# Patient Record
Sex: Female | Born: 1960 | Race: White | Hispanic: No | Marital: Married | Smoking: Never smoker
Health system: Southern US, Community
[De-identification: ages and names within clinical notes are randomized; demographics above are authoritative.]

## PROBLEM LIST (undated history)

## (undated) DIAGNOSIS — F419 Anxiety disorder, unspecified: Secondary | ICD-10-CM

## (undated) DIAGNOSIS — L409 Psoriasis, unspecified: Secondary | ICD-10-CM

## (undated) DIAGNOSIS — T7840XA Allergy, unspecified, initial encounter: Secondary | ICD-10-CM

## (undated) DIAGNOSIS — R2231 Localized swelling, mass and lump, right upper limb: Secondary | ICD-10-CM

## (undated) DIAGNOSIS — J45909 Unspecified asthma, uncomplicated: Secondary | ICD-10-CM

## (undated) DIAGNOSIS — E559 Vitamin D deficiency, unspecified: Secondary | ICD-10-CM

## (undated) DIAGNOSIS — M8580 Other specified disorders of bone density and structure, unspecified site: Secondary | ICD-10-CM

## (undated) DIAGNOSIS — I10 Essential (primary) hypertension: Secondary | ICD-10-CM

## (undated) DIAGNOSIS — G4700 Insomnia, unspecified: Secondary | ICD-10-CM

## (undated) DIAGNOSIS — E119 Type 2 diabetes mellitus without complications: Secondary | ICD-10-CM

## (undated) DIAGNOSIS — N943 Premenstrual tension syndrome: Secondary | ICD-10-CM

## (undated) DIAGNOSIS — E7800 Pure hypercholesterolemia, unspecified: Secondary | ICD-10-CM

## (undated) HISTORY — DX: Localized swelling, mass and lump, right upper limb: R22.31

## (undated) HISTORY — DX: Pure hypercholesterolemia, unspecified: E78.00

## (undated) HISTORY — DX: Other specified disorders of bone density and structure, unspecified site: M85.80

## (undated) HISTORY — DX: Unspecified asthma, uncomplicated: J45.909

## (undated) HISTORY — DX: Vitamin D deficiency, unspecified: E55.9

## (undated) HISTORY — DX: Premenstrual tension syndrome: N94.3

## (undated) HISTORY — DX: Psoriasis, unspecified: L40.9

## (undated) HISTORY — DX: Anxiety disorder, unspecified: F41.9

## (undated) HISTORY — DX: Insomnia, unspecified: G47.00

## (undated) HISTORY — DX: Allergy, unspecified, initial encounter: T78.40XA

## (undated) HISTORY — DX: Essential (primary) hypertension: I10

## (undated) HISTORY — PX: CHOLECYSTECTOMY: SHX55

## (undated) HISTORY — DX: Type 2 diabetes mellitus without complications: E11.9

---

## 1997-10-28 ENCOUNTER — Inpatient Hospital Stay (HOSPITAL_COMMUNITY): Admission: AD | Admit: 1997-10-28 | Discharge: 1997-10-28 | Payer: Self-pay | Admitting: Obstetrics and Gynecology

## 1998-01-06 ENCOUNTER — Other Ambulatory Visit: Admission: RE | Admit: 1998-01-06 | Discharge: 1998-01-06 | Payer: Self-pay | Admitting: Obstetrics and Gynecology

## 1998-02-17 ENCOUNTER — Inpatient Hospital Stay (HOSPITAL_COMMUNITY): Admission: AD | Admit: 1998-02-17 | Discharge: 1998-02-19 | Payer: Self-pay | Admitting: Obstetrics & Gynecology

## 1999-04-21 ENCOUNTER — Other Ambulatory Visit: Admission: RE | Admit: 1999-04-21 | Discharge: 1999-04-21 | Payer: Self-pay | Admitting: Obstetrics & Gynecology

## 1999-06-11 ENCOUNTER — Ambulatory Visit (HOSPITAL_COMMUNITY): Admission: RE | Admit: 1999-06-11 | Discharge: 1999-06-11 | Payer: Self-pay | Admitting: Family Medicine

## 1999-06-11 ENCOUNTER — Encounter: Payer: Self-pay | Admitting: Obstetrics & Gynecology

## 1999-06-30 ENCOUNTER — Encounter: Payer: Self-pay | Admitting: Obstetrics & Gynecology

## 1999-06-30 ENCOUNTER — Ambulatory Visit (HOSPITAL_COMMUNITY): Admission: RE | Admit: 1999-06-30 | Discharge: 1999-06-30 | Payer: Self-pay | Admitting: Obstetrics & Gynecology

## 1999-09-06 ENCOUNTER — Encounter: Payer: Self-pay | Admitting: Obstetrics and Gynecology

## 1999-09-06 ENCOUNTER — Ambulatory Visit (HOSPITAL_COMMUNITY): Admission: RE | Admit: 1999-09-06 | Discharge: 1999-09-06 | Payer: Self-pay | Admitting: Obstetrics and Gynecology

## 1999-09-27 HISTORY — PX: TUBAL LIGATION: SHX77

## 1999-12-03 ENCOUNTER — Inpatient Hospital Stay (HOSPITAL_COMMUNITY): Admission: AD | Admit: 1999-12-03 | Discharge: 1999-12-06 | Payer: Self-pay | Admitting: Family Medicine

## 2000-01-05 ENCOUNTER — Other Ambulatory Visit: Admission: RE | Admit: 2000-01-05 | Discharge: 2000-01-05 | Payer: Self-pay | Admitting: Gynecology

## 2007-11-19 DIAGNOSIS — M8580 Other specified disorders of bone density and structure, unspecified site: Secondary | ICD-10-CM

## 2007-11-19 HISTORY — DX: Other specified disorders of bone density and structure, unspecified site: M85.80

## 2008-09-26 HISTORY — PX: COLPOSCOPY: SHX161

## 2009-10-16 ENCOUNTER — Ambulatory Visit: Payer: Self-pay | Admitting: Internal Medicine

## 2009-11-25 ENCOUNTER — Ambulatory Visit: Payer: Self-pay | Admitting: Family

## 2009-12-01 ENCOUNTER — Ambulatory Visit: Payer: Self-pay | Admitting: Gastroenterology

## 2009-12-01 HISTORY — PX: COLONOSCOPY: SHX174

## 2010-01-18 ENCOUNTER — Ambulatory Visit: Payer: Self-pay | Admitting: Gastroenterology

## 2012-04-20 ENCOUNTER — Other Ambulatory Visit: Payer: Self-pay | Admitting: Gastroenterology

## 2012-04-20 LAB — CLOSTRIDIUM DIFFICILE BY PCR

## 2012-04-23 ENCOUNTER — Ambulatory Visit: Payer: Self-pay | Admitting: Gastroenterology

## 2012-09-26 HISTORY — PX: UPPER GASTROINTESTINAL ENDOSCOPY: SHX188

## 2013-01-24 DIAGNOSIS — E559 Vitamin D deficiency, unspecified: Secondary | ICD-10-CM

## 2013-01-24 HISTORY — DX: Vitamin D deficiency, unspecified: E55.9

## 2013-10-30 ENCOUNTER — Encounter: Payer: Self-pay | Admitting: Adult Health

## 2013-10-30 ENCOUNTER — Ambulatory Visit (INDEPENDENT_AMBULATORY_CARE_PROVIDER_SITE_OTHER): Payer: BC Managed Care – PPO | Admitting: Adult Health

## 2013-10-30 VITALS — BP 136/76 | HR 103 | Temp 98.2°F | Resp 12 | Ht 64.25 in | Wt 177.0 lb

## 2013-10-30 DIAGNOSIS — E785 Hyperlipidemia, unspecified: Secondary | ICD-10-CM | POA: Insufficient documentation

## 2013-10-30 DIAGNOSIS — E1165 Type 2 diabetes mellitus with hyperglycemia: Secondary | ICD-10-CM | POA: Insufficient documentation

## 2013-10-30 DIAGNOSIS — Z0000 Encounter for general adult medical examination without abnormal findings: Secondary | ICD-10-CM | POA: Insufficient documentation

## 2013-10-30 DIAGNOSIS — E119 Type 2 diabetes mellitus without complications: Secondary | ICD-10-CM

## 2013-10-30 NOTE — Progress Notes (Signed)
Patient ID: Michelle Mckenzie, female   DOB: 04/18/1961, 53 y.o.   MRN: [id number]    Subjective:    Patient ID: Michelle Minallison K Keckler, female    DOB: 01/11/1961, 53 y.o.   MRN: [id number]  HPI  Pt is a pleasant 53 y/o female who presents to clinic to establish care. She has a hx of DM and followed [facility]. She also has a GI provider, Owens Sharkawn Harrison, [facility]. She also is followed by Levin ErpAlicia Copeland at [facility]. Last PAP 2013 pt reports normal findings. Mammogram 2014 also normal. She is not currently exercising. Used to run and is planning on starting back. She reports family illnesses prevented her from running. Diet consists of healthy food options. Avoids fried foods and fast foods as these tend to affect her stomach. Overall she is feeling well.   Past Medical History  Diagnosis Date  . Diabetes mellitus without complication   . Asthma     No flares in 20 years  . Allergy      Review of Systems  Constitutional: Negative.   HENT: Negative.   Eyes: Negative.   Respiratory: Negative.   Cardiovascular: Negative.   Gastrointestinal: Negative.   Endocrine: Negative.   Genitourinary: Negative.   Musculoskeletal: Negative.   Skin: Negative.   Allergic/Immunologic: Negative.   Neurological: Negative.   Hematological: Negative.   Psychiatric/Behavioral: Negative.        Objective:  BP 136/76  Pulse 103  Temp(Src) 98.2 F (36.8 C) (Oral)  Resp 12  Ht 5' 4.25" (1.632 m)  Wt 177 lb (80.287 kg)  BMI 30.14 kg/m2  SpO2 97%   Physical Exam  Constitutional: She is oriented to person, place, and time. She appears well-developed and well-nourished. No distress.  HENT:  Head: Normocephalic and atraumatic.  Right Ear: External ear normal.  Left Ear: External ear normal.  Nose: Nose normal.  Mouth/Throat: Oropharynx is clear and moist.  Eyes: Conjunctivae and EOM are normal. Pupils are equal, round, and reactive to light.  Neck: Normal range of motion. Neck supple. No tracheal  deviation present. No thyromegaly present.  Cardiovascular: Normal rate, regular rhythm, normal heart sounds and intact distal pulses.  Exam reveals no gallop and no friction rub.   No murmur heard. Pulmonary/Chest: Effort normal and breath sounds normal. No respiratory distress. She has no wheezes. She has no rales.  Abdominal: Soft. Bowel sounds are normal. She exhibits no distension and no mass. There is no tenderness. There is no rebound and no guarding.  Musculoskeletal: Normal range of motion. She exhibits no edema and no tenderness.  Lymphadenopathy:    She has no cervical adenopathy.  Neurological: She is alert and oriented to person, place, and time. She has normal reflexes. No cranial nerve deficit. Coordination normal.  Skin: Skin is warm and dry.  Psychiatric: She has a normal mood and affect. Her behavior is normal. Judgment and thought content normal.       Assessment & Plan:    1. Routine physical examination Normal exam excluding GYN, breast. She is followed at [facility]. Labs recently done at endocrinologist office. Request records.  2. Diabetes type 2 Followed by [facility]Paul [facility]. Request records including labs.

## 2013-10-30 NOTE — Progress Notes (Signed)
Pre visit review using our clinic review tool, if applicable. No additional management support is needed unless otherwise documented below in the visit note. 

## 2013-10-30 NOTE — Patient Instructions (Signed)
  Thank you for choosing [facility] for your [facility] needs.  If you have any question or concerns please feel free to call our office.  Remember to activate your MyChart Account. The instructions are at the end of this form.

## 2013-11-01 ENCOUNTER — Telehealth: Payer: Self-pay

## 2013-11-01 NOTE — Telephone Encounter (Signed)
Relevant patient education mailed to patient.  

## 2013-12-25 ENCOUNTER — Other Ambulatory Visit: Payer: Self-pay | Admitting: Adult Health

## 2013-12-25 DIAGNOSIS — E119 Type 2 diabetes mellitus without complications: Secondary | ICD-10-CM

## 2014-04-01 ENCOUNTER — Telehealth: Payer: Self-pay

## 2014-04-01 NOTE — Telephone Encounter (Signed)
Per Michelle Mckenzie called patient to notify her that she needs to schedule a diabetic follow up appt and needs lab work. Patient did not answer. Detailed message left on voicemail.

## 2014-05-01 DIAGNOSIS — E669 Obesity, unspecified: Secondary | ICD-10-CM | POA: Insufficient documentation

## 2014-05-05 ENCOUNTER — Encounter: Payer: Self-pay | Admitting: *Deleted

## 2014-05-22 DIAGNOSIS — E559 Vitamin D deficiency, unspecified: Secondary | ICD-10-CM | POA: Insufficient documentation

## 2014-11-14 ENCOUNTER — Encounter: Payer: Self-pay | Admitting: Nurse Practitioner

## 2014-11-14 ENCOUNTER — Ambulatory Visit (INDEPENDENT_AMBULATORY_CARE_PROVIDER_SITE_OTHER): Payer: BC Managed Care – PPO | Admitting: Nurse Practitioner

## 2014-11-14 VITALS — BP 140/84 | HR 93 | Temp 98.5°F | Resp 12 | Ht 64.25 in | Wt 154.0 lb

## 2014-11-14 DIAGNOSIS — Z8639 Personal history of other endocrine, nutritional and metabolic disease: Secondary | ICD-10-CM

## 2014-11-14 NOTE — Progress Notes (Signed)
   Subjective:    Patient ID: Michelle Mckenzie, female    DOB: 10/25/1960, 54 y.o.   MRN: [id number]  HPI  [name] is a 54 yo female with a CC of cold arms x 6 months.   1) Pt letting me know:  Eye exam in last 2 years  Refuses flu shot   2) Arms from shoulders down stay cold, painful started about 6 months ago, feels like ice is running through her veins, intermittent, doesn't notice at a certain time of day, does not affect grip or holding items Multivitamin Thrive for women- no iron  Review of Systems  Constitutional: Positive for chills. Negative for fever, diaphoresis and fatigue.  Respiratory: Negative for chest tightness, shortness of breath and wheezing.   Cardiovascular: Negative for chest pain, palpitations and leg swelling.  Gastrointestinal: Negative for nausea, vomiting, diarrhea and rectal pain.  Skin: Negative for rash.  Neurological: Negative for dizziness, weakness, numbness and headaches.  Psychiatric/Behavioral: The patient is not nervous/anxious.        Objective:   Physical Exam  Constitutional: She is oriented to person, place, and time. She appears well-developed and well-nourished. No distress.  BP 140/84 mmHg  Pulse 93  Temp(Src) 98.5 F (36.9 C) (Oral)  Resp 12  Ht 5' 4.25" (1.632 m)  Wt 154 lb (69.854 kg)  BMI 26.23 kg/m2  SpO2 98%   HENT:  Head: Normocephalic and atraumatic.  Right Ear: External ear normal.  Left Ear: External ear normal.  Cardiovascular: Normal rate, regular rhythm, normal heart sounds and intact distal pulses.  Exam reveals no gallop and no friction rub.   No murmur heard. Pulmonary/Chest: Effort normal and breath sounds normal. No respiratory distress. She has no wheezes. She has no rales. She exhibits no tenderness.  Neurological: She is alert and oriented to person, place, and time. No cranial nerve deficit. She exhibits normal muscle tone. Coordination normal.  Skin: Skin is warm and dry. No rash noted. She is not  diaphoretic.  Cold upper extremities and spoon shaped nails. Suspect anemia. [facility]   Psychiatric: She has a normal mood and affect. Her behavior is normal. Judgment and thought content normal.      Assessment & Plan:

## 2014-11-14 NOTE — Progress Notes (Signed)
Pre visit review using our clinic review tool, if applicable. No additional management support is needed unless otherwise documented below in the visit note. 

## 2014-11-14 NOTE — Patient Instructions (Signed)
Iron Deficiency Anemia Anemia is a condition in which there are less red blood cells or hemoglobin in the blood than normal. Hemoglobin is the part of red blood cells that carries oxygen. Iron deficiency anemia is anemia caused by too little iron. It is the most common type of anemia. It may leave you tired and short of breath. CAUSES   Lack of iron in the diet.  Poor absorption of iron, as seen with intestinal disorders.  Intestinal bleeding.  Heavy periods. SIGNS AND SYMPTOMS  Mild anemia may not be noticeable. Symptoms may include:  Fatigue.  Headache.  Pale skin.  Weakness.  Tiredness.  Shortness of breath.  Dizziness.  Cold hands and feet.  Fast or irregular heartbeat. DIAGNOSIS  Diagnosis requires a thorough evaluation and physical exam by your health care provider. Blood tests are generally used to confirm iron deficiency anemia. Additional tests may be done to find the underlying cause of your anemia. These may include:  Testing for blood in the stool (fecal occult blood test).  A procedure to see inside the colon and rectum (colonoscopy).  A procedure to see inside the esophagus and stomach (endoscopy). TREATMENT  Iron deficiency anemia is treated by correcting the cause of the deficiency. Treatment may involve:  Adding iron-rich foods to your diet.  Taking iron supplements. Pregnant or breastfeeding women need to take extra iron because their normal diet usually does not provide the required amount.  Taking vitamins. Vitamin C improves the absorption of iron. Your health care provider may recommend that you take your iron tablets with a glass of orange juice or vitamin C supplement.  Medicines to make heavy menstrual flow lighter.  Surgery. HOME CARE INSTRUCTIONS   Take iron as directed by your health care provider.  If you cannot tolerate taking iron supplements by mouth, talk to your health care provider about taking them through a vein  (intravenously) or an injection into a muscle.  For the best iron absorption, iron supplements should be taken on an empty stomach. If you cannot tolerate them on an empty stomach, you may need to take them with food.  Do not drink milk or take antacids at the same time as your iron supplements. Milk and antacids may interfere with the absorption of iron.  Iron supplements can cause constipation. Make sure to include fiber in your diet to prevent constipation. A stool softener may also be recommended.  Take vitamins as directed by your health care provider.  Eat a diet rich in iron. Foods high in iron include liver, lean beef, whole-grain bread, eggs, dried fruit, and dark green leafy vegetables. SEEK IMMEDIATE MEDICAL CARE IF:   You faint. If this happens, do not drive. Call your local emergency services (911 in [place] if no other help is available.  You have chest pain.  You feel nauseous or vomit.  You have severe or increased shortness of breath with activity.  You feel weak.  You have a rapid heartbeat.  You have unexplained sweating.  You become light-headed when getting up from a chair or bed. MAKE SURE YOU:   Understand these instructions.  Will watch your condition.  Will get help right away if you are not doing well or get worse. Document Released: 09/09/2000 Document Revised: 09/17/2013 Document Reviewed: 05/20/2013 ExitCare Patient Information 2015 [employer]. This information is not intended to replace advice given to you by your health care provider. Make sure you discuss any questions you have with your health care provider.  

## 2014-11-17 DIAGNOSIS — Z8639 Personal history of other endocrine, nutritional and metabolic disease: Secondary | ICD-10-CM | POA: Insufficient documentation

## 2014-11-17 NOTE — Assessment & Plan Note (Signed)
Pt reports a history of IDA. She thinks she had a CBC done and I could not find it in care everywhere. I asked her to give us results or talk with the provider who ordered lab work. This may explain the symptoms she is experiencing. Asked her to take an OTC ferrous sulfate 325 mg daily supplement. Gave handout on IDA. FU in 3 months.

## 2015-02-12 ENCOUNTER — Ambulatory Visit: Payer: BC Managed Care – PPO | Admitting: Nurse Practitioner

## 2015-04-20 ENCOUNTER — Telehealth: Payer: Self-pay | Admitting: Nurse Practitioner

## 2015-04-20 NOTE — Telephone Encounter (Signed)
Good work! Thanks for your help.

## 2015-04-20 NOTE — Telephone Encounter (Signed)
Please advise.  You last saw her to establish care on 2/19.

## 2015-04-20 NOTE — Telephone Encounter (Signed)
The patient is wanting to know if she should continue to see a chiropractor

## 2015-04-20 NOTE — Telephone Encounter (Signed)
I spoke with the patient, she has a pinched nerve in her back that is affecting her arms.  She was taking a large amount of advil, which has upset her stomach.  She is requesting advice on what medication she can take instead.  I advised her that large amounts of advil are not a good choice.  I have scheduled her to see you on Wednesday this week to discuss further.

## 2015-04-20 NOTE — Telephone Encounter (Signed)
Please advise the patient that is not a recommendation for [place] to make. It is her personal choice. If she is having a complaint that needs to be evaluated she is welcome to make an appointment. Thanks!

## 2015-04-22 ENCOUNTER — Ambulatory Visit (INDEPENDENT_AMBULATORY_CARE_PROVIDER_SITE_OTHER): Payer: BC Managed Care – PPO | Admitting: Nurse Practitioner

## 2015-04-22 ENCOUNTER — Encounter (INDEPENDENT_AMBULATORY_CARE_PROVIDER_SITE_OTHER): Payer: Self-pay

## 2015-04-22 VITALS — BP 138/100 | HR 101 | Temp 98.0°F | Resp 16 | Ht 64.0 in | Wt 150.4 lb

## 2015-04-22 DIAGNOSIS — M25512 Pain in left shoulder: Secondary | ICD-10-CM | POA: Diagnosis not present

## 2015-04-22 MED ORDER — CYCLOBENZAPRINE HCL 5 MG PO TABS: 5.0000 mg | ORAL_TABLET | Freq: Every day | ORAL | Status: DC

## 2015-04-22 MED ORDER — PREDNISONE 10 MG PO TABS: ORAL_TABLET | ORAL | Status: DC

## 2015-04-22 NOTE — Patient Instructions (Addendum)
Prednisone with breakfast  6 tablets on day 1, 5 tablets on day 2, 4 tablets on day 3, 3 tablets on day 4, 2 tablets day 5, 1 tablet on day 6...done! Do not take with Advil, aspirin, ibuprofen ect...  Flexeril- at night (can take during day if not driving or operating machinery)   Ice (not heat yet).   Call me Friday if not having relief.

## 2015-04-22 NOTE — Progress Notes (Signed)
   Subjective:    Patient ID: Michelle Mckenzie, female    DOB: 09/25/1961, 54 y.o.   MRN: [id number]  HPI  Michelle Mckenzie is a 54 yo female with a CC of left shoulder pain x 6 months.   1) 6 months of ongoing left shoulder pain. 2 weeks worsening since seeing the Chiropractor, having headaches, mid-lateral arm, trapezius muscles and down spine   Advil- upset her stomach  Chiropractor- not helpful  Ice- helpful   Review of Systems  Constitutional: Negative for fever, chills, diaphoresis and fatigue.  Respiratory: Negative for chest tightness, shortness of breath and wheezing.   Cardiovascular: Negative for chest pain, palpitations and leg swelling.  Gastrointestinal: Negative for nausea, vomiting and diarrhea.  Musculoskeletal: Positive for arthralgias.  Skin: Negative for rash.  Neurological: Negative for dizziness, weakness, numbness and headaches.  Psychiatric/Behavioral: The patient is not nervous/anxious.       Objective:   Physical Exam  Constitutional: She is oriented to person, place, and time. She appears well-developed and well-nourished. No distress.  BP 138/100 mmHg  Pulse 101  Temp(Src) 98 F (36.7 C)  Resp 16  Ht  (1.626 m)  Wt 150 lb 6.4 oz (68.221 kg)  BMI 25.80 kg/m2  SpO2 97%   HENT:  Head: Normocephalic and atraumatic.  Right Ear: External ear normal.  Left Ear: External ear normal.  Cardiovascular: Normal rate, regular rhythm and normal heart sounds.  Exam reveals no gallop and no friction rub.   No murmur heard. Pulmonary/Chest: Effort normal and breath sounds normal. No respiratory distress. She has no wheezes. She has no rales. She exhibits no tenderness.  Musculoskeletal: She exhibits tenderness. She exhibits no edema.  Neurological: She is alert and oriented to person, place, and time. No cranial nerve deficit. She exhibits normal muscle tone. Coordination normal.  Skin: Skin is warm and dry. No rash noted. She is not diaphoretic.  Psychiatric:  She has a normal mood and affect. Her behavior is normal. Judgment and thought content normal.      Assessment & Plan:

## 2015-04-22 NOTE — Progress Notes (Signed)
Pre visit review using our clinic review tool, if applicable. No additional management support is needed unless otherwise documented below in the visit note. 

## 2015-05-03 ENCOUNTER — Encounter: Payer: Self-pay | Admitting: Nurse Practitioner

## 2015-05-03 NOTE — Assessment & Plan Note (Signed)
Worsening. Prednisone taper with instructions on AVS and reviewed w/ pt to satisfaction. Will also try flexeril and ice. FU Friday by phone if not helping.

## 2015-05-29 ENCOUNTER — Telehealth: Payer: Self-pay | Admitting: Nurse Practitioner

## 2015-05-29 ENCOUNTER — Other Ambulatory Visit: Payer: Self-pay | Admitting: Nurse Practitioner

## 2015-05-29 DIAGNOSIS — M25512 Pain in left shoulder: Secondary | ICD-10-CM

## 2015-05-29 MED ORDER — MELOXICAM 15 MG PO TABS: 15.0000 mg | ORAL_TABLET | Freq: Every day | ORAL | Status: DC

## 2015-05-29 NOTE — Telephone Encounter (Signed)
Pt would like to discuss some issues with shoulder and neck and possible needing an xray. Thank You!

## 2015-05-29 NOTE — Telephone Encounter (Signed)
Spoke with pt, she states she prefers to be referred to [facility].  She also requests what she should do in reference to pain management in the meantime.

## 2015-05-29 NOTE — Telephone Encounter (Signed)
Spoke with pt advised of message.  Pt verbalized understanding. 

## 2015-05-29 NOTE — Telephone Encounter (Signed)
Let pt know: I can place an x-ray order but that typically doesn't show the rotator cuff. This is the first step so insurance can pay. We can get an MRI scheduled after xray results. Other option is to go to Ortho and they can get imaging quickly. Let me know what she decides.

## 2015-05-29 NOTE — Telephone Encounter (Signed)
I will call in some meloxicam for the weekend, ice, and minimize use of shoulder. Take as directed and I will place that order. Thanks!

## 2015-05-29 NOTE — Telephone Encounter (Signed)
Pt was seen on 7.27.16 given Prednisone pt is requesting further testing because she believes it is possibly her rotator cuff.  States she is still having severe pain at night.  Please advise

## 2015-06-24 ENCOUNTER — Other Ambulatory Visit: Payer: Self-pay | Admitting: Nurse Practitioner

## 2015-07-01 ENCOUNTER — Other Ambulatory Visit: Payer: Self-pay | Admitting: Student

## 2015-07-01 DIAGNOSIS — M7582 Other shoulder lesions, left shoulder: Secondary | ICD-10-CM

## 2015-07-01 DIAGNOSIS — M25512 Pain in left shoulder: Secondary | ICD-10-CM

## 2015-07-08 ENCOUNTER — Ambulatory Visit: Payer: BC Managed Care – PPO

## 2015-08-06 ENCOUNTER — Ambulatory Visit (INDEPENDENT_AMBULATORY_CARE_PROVIDER_SITE_OTHER): Payer: BC Managed Care – PPO | Admitting: Nurse Practitioner

## 2015-08-06 ENCOUNTER — Encounter: Payer: Self-pay | Admitting: Nurse Practitioner

## 2015-08-06 VITALS — BP 122/76 | HR 80 | Temp 98.3°F | Wt 140.0 lb

## 2015-08-06 DIAGNOSIS — J0110 Acute frontal sinusitis, unspecified: Secondary | ICD-10-CM | POA: Diagnosis not present

## 2015-08-06 MED ORDER — FLUTICASONE PROPIONATE 50 MCG/ACT NA SUSP: 2.0000 | Freq: Every day | NASAL | Status: DC

## 2015-08-06 NOTE — Patient Instructions (Signed)

## 2015-08-13 DIAGNOSIS — J019 Acute sinusitis, unspecified: Secondary | ICD-10-CM | POA: Insufficient documentation

## 2015-08-13 NOTE — Progress Notes (Signed)
Patient ID: Michelle Minallison K Vanderlinde, female    DOB: 04/15/1961  Age: 54 y.o. MRN: [id number]  CC: Sinusitis   HPI Michelle Mckenzie presents for CC of 1 week of sinusitis.  1) Patient reports sinus drainage, itchy throat, face and ear pressure. Frontal headache. Denies sick contacts  Denies tooth pain or malodorous breath   History Michelle Mckenzie has a past medical history of Diabetes mellitus without complication (HCC); Asthma; and Allergy.   She has past surgical history that includes Cholecystectomy; Colonoscopy (2010); and Upper gastrointestinal endoscopy (2014).   Her family history includes Arthritis in her maternal grandfather, maternal grandmother, paternal grandfather, and paternal grandmother; Cancer in her brother and father; Heart disease (age of onset: 6950) in her paternal grandmother; Stroke in her maternal grandmother.She reports that she has never smoked. She has never used smokeless tobacco. She reports that she drinks alcohol. She reports that she does not use illicit drugs.  Outpatient Prescriptions Prior to Visit  Medication Sig Dispense Refill  . Apremilast 30 MG TABS Take 1 tablet by mouth 2 (two) times daily.    . Canagliflozin (INVOKANA PO) Take 1 tablet by mouth daily.    . hydrOXYzine (ATARAX/VISTARIL) 25 MG tablet Take 25 mg by mouth at bedtime.     . meloxicam (MOBIC) 15 MG tablet TAKE 1 TABLET BY MOUTH EVERY DAY 30 tablet 5  . metformin (FORTAMET) 1000 MG (OSM) 24 hr tablet Take 2,000 mg by mouth at bedtime.     Marland Kitchen. MICRONIZED COLESTIPOL HCL 1 G tablet Take 1 g by mouth 2 (two) times daily.     Marland Kitchen. NOVOTWIST 32G X 5 MM MISC     . VICTOZA 18 MG/3ML SOPN Inject 1.8 Units into the skin daily.     Marland Kitchen. zolpidem (AMBIEN) 10 MG tablet Take 10 mg by mouth at bedtime. Take 1/2 tablet at bedtime    . cyclobenzaprine (FLEXERIL) 5 MG tablet Take 1 tablet (5 mg total) by mouth at bedtime. (Patient not taking: Reported on 08/06/2015) 30 tablet 0   No facility-administered medications prior to  visit.    ROS Review of Systems  Constitutional: Negative for fever, chills, diaphoresis and fatigue.  HENT: Positive for congestion, ear pain, postnasal drip and rhinorrhea. Negative for ear discharge.   Eyes: Negative for visual disturbance.  Respiratory: Positive for cough. Negative for chest tightness, shortness of breath and wheezing.   Cardiovascular: Negative for chest pain, palpitations and leg swelling.  Gastrointestinal: Negative for nausea, vomiting and diarrhea.  Skin: Negative for rash.  Neurological: Negative for dizziness, weakness, numbness and headaches.  Psychiatric/Behavioral: The patient is not nervous/anxious.     Objective:  BP 122/76 mmHg  Pulse 80  Temp(Src) 98.3 F (36.8 C) (Oral)  Wt 140 lb (63.504 kg)  SpO2 97%  Physical Exam  Constitutional: She is oriented to person, place, and time. She appears well-developed and well-nourished. No distress.  HENT:  Head: Normocephalic and atraumatic.  Right Ear: External ear normal.  Left Ear: External ear normal.  TMs clear bilaterally  Eyes: EOM are normal. Pupils are equal, round, and reactive to light. Right eye exhibits no discharge. Left eye exhibits no discharge. No scleral icterus.  Neck: Normal range of motion. Neck supple.  Cardiovascular: Normal rate, regular rhythm and normal heart sounds.  Exam reveals no gallop and no friction rub.   No murmur heard. Pulmonary/Chest: Effort normal and breath sounds normal. No respiratory distress. She has no wheezes. She has no rales. She exhibits no tenderness.  Lymphadenopathy:    She has no cervical adenopathy.  Neurological: She is alert and oriented to person, place, and time. No cranial nerve deficit. She exhibits normal muscle tone. Coordination normal.  Skin: Skin is warm and dry. No rash noted. She is not diaphoretic.  Psychiatric: She has a normal mood and affect. Her behavior is normal. Judgment and thought content normal.   Assessment & Plan:    Vana was seen today for sinusitis.  Diagnoses and all orders for this visit:  Acute frontal sinusitis, recurrence not specified  Other orders -     fluticasone (FLONASE) 50 MCG/ACT nasal spray; Place 2 sprays into both nostrils daily.  I am having Ms. Dangerfield start on fluticasone. I am also having her maintain her MICRONIZED COLESTIPOL HCL, hydrOXYzine, zolpidem, metformin, VICTOZA, NOVOTWIST, Canagliflozin (INVOKANA PO), Apremilast, cyclobenzaprine, meloxicam, and cetirizine.  Meds ordered this encounter  Medications  . cetirizine (ZYRTEC) 10 MG tablet    Sig: Take 10 mg by mouth daily.  . fluticasone (FLONASE) 50 MCG/ACT nasal spray    Sig: Place 2 sprays into both nostrils daily.    Dispense:  16 g    Refill:  2    Order Specific Question:  Supervising Provider    Answer:  Sherlene Shams [[id number]]     Follow-up: Return if symptoms worsen or fail to improve.

## 2015-08-13 NOTE — Assessment & Plan Note (Signed)
Flonase and Zyrtec given to pt. If not helpful let me know and I will send in abx.   Will follow

## 2015-11-18 ENCOUNTER — Other Ambulatory Visit: Payer: Self-pay

## 2015-11-18 MED ORDER — MELOXICAM 15 MG PO TABS: 15.0000 mg | ORAL_TABLET | Freq: Every day | ORAL | Status: DC

## 2015-11-25 ENCOUNTER — Encounter: Payer: Self-pay | Admitting: Nurse Practitioner

## 2015-11-25 ENCOUNTER — Ambulatory Visit (INDEPENDENT_AMBULATORY_CARE_PROVIDER_SITE_OTHER): Payer: BC Managed Care – PPO | Admitting: Nurse Practitioner

## 2015-11-25 VITALS — BP 126/84 | HR 95 | Temp 98.0°F | Ht 64.0 in | Wt 151.0 lb

## 2015-11-25 DIAGNOSIS — K591 Functional diarrhea: Secondary | ICD-10-CM | POA: Diagnosis not present

## 2015-11-25 DIAGNOSIS — R21 Rash and other nonspecific skin eruption: Secondary | ICD-10-CM | POA: Diagnosis not present

## 2015-11-25 MED ORDER — COLESTIPOL HCL 1 G PO TABS: 1.0000 g | ORAL_TABLET | Freq: Two times a day (BID) | ORAL | Status: DC

## 2015-11-25 MED ORDER — NYSTATIN 100000 UNIT/GM EX OINT: 1.0000 "application " | TOPICAL_OINTMENT | Freq: Two times a day (BID) | CUTANEOUS | Status: DC

## 2015-11-25 NOTE — Progress Notes (Signed)
Pre visit review using our clinic review tool, if applicable. No additional management support is needed unless otherwise documented below in the visit note. 

## 2015-11-25 NOTE — Progress Notes (Signed)
Patient ID: AARON BOEH, female    DOB: 17-Apr-1961  Age: 55 y.o. MRN: [id number]  CC: Acute Visit   HPI Michelle Mckenzie presents for CC of breast rash and wanting to discuss colestipol  1) Rash under breasts worsening over the last week Wears a sports bra when running, but showers soon after  Water and gold bond powder Tea tree oil   2) Upper and lower endoscopy  Has diarrhea after gallbladder was removed  Tried being off for 1.5 months   History Michelle Mckenzie has a past medical history of Diabetes mellitus without complication (HCC); Asthma; and Allergy.   Michelle Mckenzie has past surgical history that includes Cholecystectomy; Colonoscopy (2010); and Upper gastrointestinal endoscopy (2014).   Her family history includes Arthritis in her maternal grandfather, maternal grandmother, paternal grandfather, and paternal grandmother; Cancer in her brother and father; Heart disease (age of onset: 65) in her paternal grandmother; Stroke in her maternal grandmother.Michelle Mckenzie reports that Michelle Mckenzie has never smoked. Michelle Mckenzie has never used smokeless tobacco. Michelle Mckenzie reports that Michelle Mckenzie drinks alcohol. Michelle Mckenzie reports that Michelle Mckenzie does not use illicit drugs.  Outpatient Prescriptions Prior to Visit  Medication Sig Dispense Refill  . Apremilast 30 MG TABS Take 1 tablet by mouth 2 (two) times daily.    . Canagliflozin (INVOKANA PO) Take 1 tablet by mouth daily.    . cetirizine (ZYRTEC) 10 MG tablet Take 10 mg by mouth daily.    . fluticasone (FLONASE) 50 MCG/ACT nasal spray Place 2 sprays into both nostrils daily. 16 g 2  . hydrOXYzine (ATARAX/VISTARIL) 25 MG tablet Take 25 mg by mouth at bedtime.     . meloxicam (MOBIC) 15 MG tablet Take 1 tablet (15 mg total) by mouth daily. 30 tablet 5  . metformin (FORTAMET) 1000 MG (OSM) 24 hr tablet Take 2,000 mg by mouth at bedtime.     Marland Kitchen NOVOTWIST 32G X 5 MM MISC     . VICTOZA 18 MG/3ML SOPN Inject 1.8 Units into the skin daily.     Marland Kitchen zolpidem (AMBIEN) 10 MG tablet Take 10 mg by mouth at bedtime.  Take 1/2 tablet at bedtime    . cyclobenzaprine (FLEXERIL) 5 MG tablet Take 1 tablet (5 mg total) by mouth at bedtime. 30 tablet 0  . MICRONIZED COLESTIPOL HCL 1 G tablet Take 1 g by mouth 2 (two) times daily.      No facility-administered medications prior to visit.    ROS Review of Systems  Constitutional: Negative for fever, chills, diaphoresis and fatigue.  Respiratory: Negative for chest tightness, shortness of breath and wheezing.   Cardiovascular: Negative for chest pain, palpitations and leg swelling.  Gastrointestinal: Positive for diarrhea. Negative for nausea and vomiting.  Skin: Positive for rash.  Neurological: Negative for dizziness and headaches.    Objective:  BP 126/84 mmHg  Pulse 95  Temp(Src) 98 F (36.7 C) (Oral)  Ht  (1.626 m)  Wt 151 lb (68.493 kg)  BMI 25.91 kg/m2  SpO2 98%  Physical Exam  Constitutional: Michelle Mckenzie is oriented to person, place, and time. Michelle Mckenzie appears well-developed and well-nourished. No distress.  HENT:  Head: Normocephalic and atraumatic.  Right Ear: External ear normal.  Left Ear: External ear normal.  Cardiovascular: Normal rate, regular rhythm and normal heart sounds.  Exam reveals no gallop and no friction rub.   No murmur heard. Pulmonary/Chest: Effort normal and breath sounds normal. No respiratory distress. Michelle Mckenzie has no wheezes. Michelle Mckenzie has no rales. Michelle Mckenzie exhibits no tenderness.  Neurological:  Michelle Mckenzie is alert and oriented to person, place, and time. No cranial nerve deficit. Michelle Mckenzie exhibits normal muscle tone. Coordination normal.  Skin: Skin is warm and dry. Rash noted. Michelle Mckenzie is not diaphoretic.     Red, papular to patchy, odorous under breasts bilateally  Psychiatric: Michelle Mckenzie has a normal mood and affect. Her behavior is normal. Judgment and thought content normal.   Assessment & Plan:   Sharea was seen today for acute visit.  Diagnoses and all orders for this visit:  Rash and nonspecific skin eruption  Functional diarrhea  Other  orders -     colestipol (MICRONIZED COLESTIPOL HCL) 1 g tablet; Take 1 tablet (1 g total) by mouth 2 (two) times daily. -     nystatin ointment (MYCOSTATIN); Apply 1 application topically 2 (two) times daily.   I have discontinued Ms. Eimer's cyclobenzaprine. I have also changed her MICRONIZED COLESTIPOL HCL to colestipol. Additionally, I am having her start on nystatin ointment. Lastly, I am having her maintain her hydrOXYzine, zolpidem, metformin, VICTOZA, NOVOTWIST, Canagliflozin (INVOKANA PO), Apremilast, cetirizine, fluticasone, and meloxicam.  Meds ordered this encounter  Medications  . colestipol (MICRONIZED COLESTIPOL HCL) 1 g tablet    Sig: Take 1 tablet (1 g total) by mouth 2 (two) times daily.    Dispense:  60 tablet    Refill:  2    Order Specific Question:  Supervising Provider    Answer:  Duncan Dull L [[id number]]  . nystatin ointment (MYCOSTATIN)    Sig: Apply 1 application topically 2 (two) times daily.    Dispense:  30 g    Refill:  0    Order Specific Question:  Supervising Provider    Answer:  Sherlene Shams [[id number]]     Follow-up: Return if symptoms worsen or fail to improve.

## 2015-11-30 DIAGNOSIS — R21 Rash and other nonspecific skin eruption: Secondary | ICD-10-CM | POA: Insufficient documentation

## 2015-11-30 DIAGNOSIS — R197 Diarrhea, unspecified: Secondary | ICD-10-CM | POA: Insufficient documentation

## 2015-11-30 NOTE — Assessment & Plan Note (Signed)
Nystatin ointment sent to pharmacy  Likely yeast FU prn worsening/failure to improve.

## 2015-11-30 NOTE — Assessment & Plan Note (Signed)
Colestipol was helpful Sent back into pharmacy Will follow as needed

## 2015-12-15 ENCOUNTER — Telehealth: Payer: BC Managed Care – PPO | Admitting: Nurse Practitioner

## 2015-12-15 ENCOUNTER — Encounter (INDEPENDENT_AMBULATORY_CARE_PROVIDER_SITE_OTHER): Payer: Self-pay

## 2015-12-15 DIAGNOSIS — J0101 Acute recurrent maxillary sinusitis: Secondary | ICD-10-CM

## 2015-12-15 MED ORDER — AZITHROMYCIN 250 MG PO TABS: ORAL_TABLET | ORAL | Status: DC

## 2015-12-15 NOTE — Progress Notes (Signed)

## 2016-01-20 ENCOUNTER — Other Ambulatory Visit: Payer: Self-pay | Admitting: Nurse Practitioner

## 2016-01-21 ENCOUNTER — Encounter: Payer: Self-pay | Admitting: Nurse Practitioner

## 2016-03-06 ENCOUNTER — Other Ambulatory Visit: Payer: Self-pay | Admitting: Nurse Practitioner

## 2016-03-08 NOTE — Telephone Encounter (Signed)
No future appointment. Last seen 11/25/15 and medication last refilled on 11/25/15. Please advise?

## 2016-03-09 NOTE — Telephone Encounter (Signed)
Refills need to come from PCP in future. Michelle Mckenzie is new PCP.

## 2016-03-09 NOTE — Telephone Encounter (Signed)
Pt called in about the medication colestipol (MICRONIZED COLESTIPOL HCL) 1 g tablet.   Thank you!

## 2016-05-02 ENCOUNTER — Other Ambulatory Visit: Payer: Self-pay | Admitting: Nurse Practitioner

## 2016-05-02 NOTE — Telephone Encounter (Signed)
Refill provided. Please get the patient scheduled sometime in the next 1-2 months for follow-up. Thanks.

## 2016-05-02 NOTE — Telephone Encounter (Signed)
Refilled 11/18/15 with 5 refills. You are listed has PCP but you have not seen patient nor does she have a appointment scheduled. Please advise?

## 2016-05-02 NOTE — Telephone Encounter (Signed)
A voicemail was left on [facility] designated phone number for pt to call and schedule an appointment with Dr.Sonnenberg.

## 2016-06-11 ENCOUNTER — Other Ambulatory Visit: Payer: Self-pay | Admitting: Family Medicine

## 2016-06-13 NOTE — Telephone Encounter (Signed)
Refilled 03/09/16 by Dr.Cook. Pt last seen 11/25/15 by Lyla Sonarrie. Pt has no future appt. Pt does not have a recent lipid panel in chart. Please advise?

## 2016-06-13 NOTE — Telephone Encounter (Signed)
Refill given. Patient needs follow-up scheduled. Thanks. 

## 2016-06-14 NOTE — Telephone Encounter (Signed)
That is fine 

## 2016-06-14 NOTE — Telephone Encounter (Signed)
Scheduled pt on 10/4 at 4 p.m. Which was the only time she could do. Are you okay with her being at that time?

## 2016-06-24 ENCOUNTER — Other Ambulatory Visit: Payer: Self-pay | Admitting: Surgical

## 2016-06-24 MED ORDER — MELOXICAM 15 MG PO TABS: 15.0000 mg | ORAL_TABLET | Freq: Every day | ORAL | 1 refills | Status: DC

## 2016-06-27 ENCOUNTER — Other Ambulatory Visit: Payer: Self-pay | Admitting: Family Medicine

## 2016-06-29 ENCOUNTER — Encounter: Payer: Self-pay | Admitting: Family Medicine

## 2016-06-29 ENCOUNTER — Ambulatory Visit (INDEPENDENT_AMBULATORY_CARE_PROVIDER_SITE_OTHER): Payer: BC Managed Care – PPO | Admitting: Family Medicine

## 2016-06-29 DIAGNOSIS — K591 Functional diarrhea: Secondary | ICD-10-CM | POA: Diagnosis not present

## 2016-06-29 DIAGNOSIS — E119 Type 2 diabetes mellitus without complications: Secondary | ICD-10-CM

## 2016-06-29 DIAGNOSIS — J302 Other seasonal allergic rhinitis: Secondary | ICD-10-CM | POA: Diagnosis not present

## 2016-06-29 DIAGNOSIS — I10 Essential (primary) hypertension: Secondary | ICD-10-CM | POA: Insufficient documentation

## 2016-06-29 DIAGNOSIS — J309 Allergic rhinitis, unspecified: Secondary | ICD-10-CM | POA: Insufficient documentation

## 2016-06-29 DIAGNOSIS — R030 Elevated blood-pressure reading, without diagnosis of hypertension: Secondary | ICD-10-CM

## 2016-06-29 MED ORDER — MONTELUKAST SODIUM 10 MG PO TABS: 10.0000 mg | ORAL_TABLET | Freq: Every day | ORAL | 3 refills | Status: DC

## 2016-06-29 NOTE — Assessment & Plan Note (Signed)
Well-controlled. Continue to follow with endocrinology. 

## 2016-06-29 NOTE — Assessment & Plan Note (Signed)
Suspect patient's right ear symptoms are related to eustachian tube dysfunction from allergic rhinitis. Hearing is intact. We will add Singulair and see if this is beneficial. If not improving in the next couple of weeks we will refer to ENT.

## 2016-06-29 NOTE — Assessment & Plan Note (Signed)
Chronic issue. Well managed with colestipol. She will continue this medication.

## 2016-06-29 NOTE — Progress Notes (Signed)
  Marikay Alar, MD Phone: [phone]  Michelle Mckenzie is a 55 y.o. female who presents today for f/u.  DIABETES-followed by endocrinology Disease Monitoring: Blood Sugar ranges-not checking, most recent A1c was 6.1 Polyuria/phagia/dipsia- no      Medications: Compliance- taking metformin and Victoza Hypoglycemic symptoms- no  Right ear fullness: Patient notes for the last 3 weeks it's felt as though there is a cup over her right ear. She always has some sinus congestion related to allergies. No ear pain. No vertigo. Some mild ringing at times. Taking Zyrtec and Flonase.  Elevated blood pressure: No history of hypertension. Not on any medications. Does not check at home. No chest pain or shortness breath.  Chronic diarrhea: Well controlled by colestipol. Has been on this for about 10 years. Had issues with diarrhea related to having her gallbladder taken out. No blood or mucus in her stool. No diarrhea recently with medication.   PMH: nonsmoker.   ROS see history of present illness  Objective  Physical Exam Vitals:   06/29/16 1607  BP: (!) 150/90  Pulse: 88  Temp: 98.2 F (36.8 C)    BP Readings from Last 3 Encounters:  06/29/16 (!) 150/90  11/25/15 126/84  08/06/15 122/76   Wt Readings from Last 3 Encounters:  06/29/16 160 lb (72.6 kg)  11/25/15 151 lb (68.5 kg)  08/06/15 140 lb (63.5 kg)    Physical Exam  Constitutional: She is well-developed, well-nourished, and in no distress.  HENT:  Head: Normocephalic and atraumatic.  Mouth/Throat: Oropharynx is clear and moist. No oropharyngeal exudate.  Bilateral TMs normal  Eyes: Conjunctivae are normal. Pupils are equal, round, and reactive to light.  Cardiovascular: Normal rate, regular rhythm and normal heart sounds.   Pulmonary/Chest: Effort normal and breath sounds normal.  Musculoskeletal: She exhibits no edema.  Neurological: She is alert. Gait normal.  Skin: Skin is warm and dry.     Assessment/Plan:  Please see individual problem list.  Diabetes type 2, controlled Well-controlled. Continue to follow with endocrinology.  Diarrhea Chronic issue. Well managed with colestipol. She will continue this medication.  Allergic rhinitis Suspect patient's right ear symptoms are related to eustachian tube dysfunction from allergic rhinitis. Hearing is intact. We will add Singulair and see if this is beneficial. If not improving in the next couple of weeks we will refer to ENT.  Elevated blood pressure reading Elevated today and similarly elevated on recheck today. Asymptomatic. We will have her check her BP several times over the next week at home and then return in 1 week for nurse visit for recheck. If elevated at that time would add lisinopril.   No orders of the defined types were placed in this encounter.   Meds ordered this encounter  Medications  . montelukast (SINGULAIR) 10 MG tablet    Sig: Take 1 tablet (10 mg total) by mouth at bedtime.    Dispense:  30 tablet    Refill:  3   Marikay Alar, MD [facility]

## 2016-06-29 NOTE — Patient Instructions (Addendum)
Nice to meet you. Please continue your current medications. We will send and Singulair for you to take for your allergies. When you need refills please let us know. Please monitor your blood pressure once a day at home and record this. I would like for you to bring this in when you follow-up with the nurse for a blood pressure check next week.

## 2016-06-29 NOTE — Assessment & Plan Note (Addendum)
Elevated today and similarly elevated on recheck today. Asymptomatic. We will have her check her BP several times over the next week at home and then return in 1 week for nurse visit for recheck. If elevated at that time would add lisinopril.

## 2016-08-16 ENCOUNTER — Other Ambulatory Visit: Payer: Self-pay | Admitting: Nurse Practitioner

## 2016-08-20 ENCOUNTER — Other Ambulatory Visit: Payer: Self-pay | Admitting: Family Medicine

## 2016-09-04 ENCOUNTER — Other Ambulatory Visit: Payer: Self-pay | Admitting: Family Medicine

## 2016-09-05 NOTE — Telephone Encounter (Signed)
Is it ok to refill this?  

## 2016-09-06 NOTE — Telephone Encounter (Signed)
Sent to pharmacy 

## 2016-10-06 ENCOUNTER — Other Ambulatory Visit: Payer: Self-pay | Admitting: Family Medicine

## 2016-10-07 NOTE — Telephone Encounter (Signed)
Last filled 08/22/16 30 1  rf

## 2016-10-07 NOTE — Telephone Encounter (Signed)
Left message to return call 

## 2016-10-07 NOTE — Telephone Encounter (Signed)
Refill sent to pharmacy. Patient's blood pressure was elevated at her last office visit. I would like her to follow up on this in the next month or so. Thanks.

## 2016-10-10 NOTE — Telephone Encounter (Signed)
Scheduled for follow up

## 2016-10-13 ENCOUNTER — Other Ambulatory Visit: Payer: Self-pay | Admitting: Family Medicine

## 2016-10-20 LAB — HEMOGLOBIN A1C: Hemoglobin A1C: 6.5

## 2016-10-24 ENCOUNTER — Ambulatory Visit (INDEPENDENT_AMBULATORY_CARE_PROVIDER_SITE_OTHER): Payer: BC Managed Care – PPO | Admitting: Family Medicine

## 2016-10-24 ENCOUNTER — Encounter: Payer: Self-pay | Admitting: Family Medicine

## 2016-10-24 ENCOUNTER — Other Ambulatory Visit: Payer: Self-pay | Admitting: Family Medicine

## 2016-10-24 VITALS — BP 164/94 | HR 99 | Temp 98.1°F | Wt 169.2 lb

## 2016-10-24 DIAGNOSIS — E119 Type 2 diabetes mellitus without complications: Secondary | ICD-10-CM

## 2016-10-24 DIAGNOSIS — B351 Tinea unguium: Secondary | ICD-10-CM | POA: Diagnosis not present

## 2016-10-24 DIAGNOSIS — R945 Abnormal results of liver function studies: Principal | ICD-10-CM

## 2016-10-24 DIAGNOSIS — I10 Essential (primary) hypertension: Secondary | ICD-10-CM | POA: Diagnosis not present

## 2016-10-24 DIAGNOSIS — R7989 Other specified abnormal findings of blood chemistry: Secondary | ICD-10-CM

## 2016-10-24 LAB — COMPREHENSIVE METABOLIC PANEL
ALK PHOS: 69 U/L (ref 39–117)
ALT: 40 U/L — AB (ref 0–35)
AST: 24 U/L (ref 0–37)
Albumin: 4.2 g/dL (ref 3.5–5.2)
BILIRUBIN TOTAL: 0.3 mg/dL (ref 0.2–1.2)
BUN: 14 mg/dL (ref 6–23)
CO2: 28 mEq/L (ref 19–32)
Calcium: 9.6 mg/dL (ref 8.4–10.5)
Chloride: 101 mEq/L (ref 96–112)
Creatinine, Ser: 0.49 mg/dL (ref 0.40–1.20)
GFR: 140.06 mL/min (ref >60.00)
GLUCOSE: 168 mg/dL — AB (ref 70–99)
Potassium: 3.8 mEq/L (ref 3.5–5.1)
SODIUM: 136 meq/L (ref 135–145)
TOTAL PROTEIN: 7.4 g/dL (ref 6.0–8.3)

## 2016-10-24 MED ORDER — LISINOPRIL 10 MG PO TABS: 10.0000 mg | ORAL_TABLET | Freq: Every day | ORAL | 3 refills | Status: DC

## 2016-10-24 NOTE — Assessment & Plan Note (Signed)
Refer to podiatry

## 2016-10-24 NOTE — Progress Notes (Signed)
  Tommi Rumps, MD Phone: [phone]  Michelle Mckenzie is a 56 y.o. female who presents today for f/u.  DIABETES Disease Monitoring: Blood Sugar ranges-has been higher than previously Polyuria/phagia/dipsia- no      Optho-gets yearly exams Medications: Compliance- taking victoza and metformin Hypoglycemic symptoms- no Most recent A1c 6.5. Followed by endocrinology.  HYPERTENSION  Disease Monitoring  Home BP Monitoring not checking Chest pain- no    Dyspnea-no No edema, no vision changes, no headaches. She's never been on medication for this.  Onychomycosis: Patient notes bilateral second toes with thickened toenails. Right greater than left. Occasionally hurts when she runs. No other skin lesions on her feet.   PMH: nonsmoker.   ROS see history of present illness  Objective  Physical Exam Vitals:   10/24/16 1458 10/24/16 1524  BP: (!) 176/98 (!) 164/94  Pulse: 99   Temp: 98.1 F (36.7 C)     BP Readings from Last 3 Encounters:  10/24/16 (!) 164/94  06/29/16 (!) 150/90  11/25/15 126/84   Wt Readings from Last 3 Encounters:  10/24/16 169 lb 3.2 oz (76.7 kg)  06/29/16 160 lb (72.6 kg)  11/25/15 151 lb (68.5 kg)    Physical Exam  Constitutional: No distress.  HENT:  Mouth/Throat: Oropharynx is clear and moist. No oropharyngeal exudate.  Eyes: Conjunctivae are normal. Pupils are equal, round, and reactive to light.  Cardiovascular: Normal rate, regular rhythm and normal heart sounds.   Pulmonary/Chest: Effort normal and breath sounds normal.  Musculoskeletal: She exhibits no edema.  Neurological: She is alert. Gait normal.  Skin: Skin is warm and dry. She is not diaphoretic.   Diabetic Foot Exam - Simple   Simple Foot Form Diabetic Foot exam was performed with the following findings:  Yes 10/24/2016  3:15 PM  Visual Inspection See comments:  Yes Sensation Testing Intact to touch and monofilament testing bilaterally:  Yes Pulse Check Posterior Tibialis  and Dorsalis pulse intact bilaterally:  Yes Comments Bilateral second toes with thickened toenails, no other deformities, ulcerations, or skin breakdown bilaterally     Assessment/Plan: Please see individual problem list.  Diabetes type 2, controlled Well-controlled though somewhat worse than previously. She'll continue her current medications and follow-up with endocrinology later this week as planned.  Hypertension Elevated for second occasion in the office. Was elevated previously and she was to follow up though never did. Given continued elevation today we will start on lisinopril. We will need to check a CMP today and BMP in 1 week. She'll monitor her blood pressure and we will recheck it in one week with the nurse visit as well.  Onychomycosis Refer to podiatry.   Orders Placed This Encounter  Procedures  . Comp Met (CMET)  . Hemoglobin A1c    This external order was created through the Results Console.  . Ambulatory referral to Podiatry    Referral Priority:   Routine    Referral Type:   Consultation    Referral Reason:   Specialty Services Required    Requested Specialty:   Podiatry    Number of Visits Requested:   [place], MD [place]

## 2016-10-24 NOTE — Assessment & Plan Note (Signed)
Elevated for second occasion in the office. Was elevated previously and she was to follow up though never did. Given continued elevation today we will start on lisinopril. We will need to check a CMP today and BMP in 1 week. She'll monitor her blood pressure and we will recheck it in one week with the nurse visit as well.

## 2016-10-24 NOTE — Patient Instructions (Signed)
Nice to see you. We'll start her on lisinopril for your blood pressure. We'll get some lab work today and contact her with the results. We will also refer you to podiatry.

## 2016-10-24 NOTE — Assessment & Plan Note (Addendum)
Well-controlled though somewhat worse than previously. She'll continue her current medications and follow-up with endocrinology later this week as planned.

## 2016-10-24 NOTE — Progress Notes (Signed)
Pre visit review using our clinic review tool, if applicable. No additional management support is needed unless otherwise documented below in the visit note. 

## 2016-11-01 ENCOUNTER — Encounter: Payer: Self-pay | Admitting: Family Medicine

## 2016-11-01 ENCOUNTER — Other Ambulatory Visit: Payer: BC Managed Care – PPO

## 2016-11-01 ENCOUNTER — Telehealth: Payer: Self-pay | Admitting: *Deleted

## 2016-11-01 DIAGNOSIS — I10 Essential (primary) hypertension: Secondary | ICD-10-CM

## 2016-11-01 NOTE — Telephone Encounter (Signed)
Pt had a RN on her job check her blood, the reading was 138/98. Pt will have the RN recheck her blood pressure tomorrow.

## 2016-11-02 MED ORDER — LOSARTAN POTASSIUM 50 MG PO TABS: 50.0000 mg | ORAL_TABLET | Freq: Every day | ORAL | 3 refills | Status: AC

## 2016-11-02 NOTE — Telephone Encounter (Signed)
Blood pressure noted. Patient should have this rechecked today. Cough can occur with lisinopril. We can attempt to try another medication that is similar that has less potential for cough to see if that would be of benefit. If she would like to change the medicine I can send this to her pharmacy.

## 2016-11-02 NOTE — Telephone Encounter (Signed)
Losartan sent to pharmacy. Patient should discontinue the lisinopril and start losartan. She'll need to return later this week or early next week for lab work.

## 2016-11-02 NOTE — Telephone Encounter (Signed)
Called patient to see how her bp is doing this morning, patient states she has not checked her bp. Patient states she is not having any symptoms related to high bp and she feels fine. She has noticed a cough since starting the lisinopril.

## 2016-11-02 NOTE — Telephone Encounter (Signed)
Patient has not had a chance to check her bp but will call with result, patient would like new bp med sent to pharmacy [facility] [address]

## 2016-11-03 NOTE — Telephone Encounter (Signed)
Left message to return call 

## 2016-11-03 NOTE — Telephone Encounter (Signed)
Called and made appointment for patient, please place order. Patient states her bp today was 140/100. Informed patient per Dr.Sonnenberg to monitor her bp and if she developed any symptoms, Ha,chest apin, change in vision to be evaluated either here, or at Er. Patient voiced understanding.

## 2016-11-03 NOTE — Telephone Encounter (Signed)
Order placed. Discussed with CMA that it may take some time for patient's blood pressure to come down with the medicine. If not improving could consider increase of medicine.

## 2016-11-07 ENCOUNTER — Ambulatory Visit (INDEPENDENT_AMBULATORY_CARE_PROVIDER_SITE_OTHER): Payer: BC Managed Care – PPO | Admitting: Podiatry

## 2016-11-07 VITALS — BP 146/89 | HR 104 | Resp 16

## 2016-11-07 DIAGNOSIS — L603 Nail dystrophy: Secondary | ICD-10-CM

## 2016-11-07 NOTE — Progress Notes (Signed)
   Subjective:    Patient ID: Michelle Mckenzie, female    DOB: 07/12/1961, 56 y.o.   MRN: [id number]  HPI: This patient presents today with a chief complaint of bilateral first and second toenail fungus 6 months states that she is an avid runner and the toes particularly the second toenails bilaterally bother her and she states they get very tender with tennis shoes. She's been treating with Vapor of which has really not helped very much.    Review of Systems  All other systems reviewed and are negative.      Objective:   Physical Exam: Vital signs are stable alert and oriented 3. Pulses are palpable. Neurologic sensorium is intact deep tendon reflexes are intact muscle strength is symmetrical ankle bilateral. Orthopedic evaluation shows all joints distal to the ankle range of motion or crepitation. Cutaneous evaluation demonstrate supple well-hydrated cutis however the hallux and the second digital nail plate currently appears to be dystrophic I'm not sure that this is fungal at all and appears to be damage to the nailbed and root which produce a thickened toenail. It is moderately tender on palpation.        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  Plan: Symptoms of the nail were taken today to receive pathologic evaluation we discussed the possible need for total matrixectomy to the second digits she understands this is amenable to an necessary.

## 2016-11-08 ENCOUNTER — Other Ambulatory Visit: Payer: BC Managed Care – PPO

## 2016-11-21 ENCOUNTER — Telehealth: Payer: Self-pay | Admitting: *Deleted

## 2016-11-21 NOTE — Telephone Encounter (Addendum)
-----   Message from Elinor ParkinsonMax T Hyatt, [place] sent at 11/21/2016  7:09 AM EST ----- Nail dystrophy negative for fungus. Informed pt of Dr. Geryl RankinsHyatt's review of results.

## 2016-11-28 ENCOUNTER — Other Ambulatory Visit: Payer: Self-pay | Admitting: Family Medicine

## 2016-12-02 ENCOUNTER — Ambulatory Visit: Payer: BC Managed Care – PPO | Admitting: Family Medicine

## 2016-12-05 ENCOUNTER — Ambulatory Visit: Payer: BC Managed Care – PPO | Admitting: Podiatry

## 2016-12-10 ENCOUNTER — Other Ambulatory Visit: Payer: Self-pay | Admitting: Family Medicine

## 2016-12-12 ENCOUNTER — Other Ambulatory Visit: Payer: Self-pay | Admitting: Obstetrics and Gynecology

## 2016-12-12 DIAGNOSIS — G4700 Insomnia, unspecified: Secondary | ICD-10-CM

## 2016-12-12 MED ORDER — ZOLPIDEM TARTRATE 10 MG PO TABS: ORAL_TABLET | ORAL | 4 refills | Status: DC

## 2017-01-05 ENCOUNTER — Other Ambulatory Visit: Payer: Self-pay | Admitting: Family Medicine

## 2017-03-16 ENCOUNTER — Encounter: Payer: Self-pay | Admitting: Family Medicine

## 2017-03-16 ENCOUNTER — Ambulatory Visit (INDEPENDENT_AMBULATORY_CARE_PROVIDER_SITE_OTHER): Payer: BC Managed Care – PPO | Admitting: Family Medicine

## 2017-03-16 VITALS — BP 140/80 | HR 87 | Temp 98.7°F | Wt 167.0 lb

## 2017-03-16 DIAGNOSIS — F419 Anxiety disorder, unspecified: Secondary | ICD-10-CM | POA: Insufficient documentation

## 2017-03-16 DIAGNOSIS — E119 Type 2 diabetes mellitus without complications: Secondary | ICD-10-CM

## 2017-03-16 DIAGNOSIS — Z7189 Other specified counseling: Secondary | ICD-10-CM

## 2017-03-16 DIAGNOSIS — I10 Essential (primary) hypertension: Secondary | ICD-10-CM | POA: Diagnosis not present

## 2017-03-16 DIAGNOSIS — J302 Other seasonal allergic rhinitis: Secondary | ICD-10-CM

## 2017-03-16 DIAGNOSIS — Z7184 Encounter for health counseling related to travel: Secondary | ICD-10-CM

## 2017-03-16 MED ORDER — NYSTATIN 100000 UNIT/GM EX CREA: 1.0000 "application " | TOPICAL_CREAM | Freq: Two times a day (BID) | CUTANEOUS | 0 refills | Status: DC

## 2017-03-16 MED ORDER — CITALOPRAM HYDROBROMIDE 10 MG PO TABS: 10.0000 mg | ORAL_TABLET | Freq: Every day | ORAL | 1 refills | Status: DC

## 2017-03-16 MED ORDER — DOXYCYCLINE HYCLATE 100 MG PO TABS: ORAL_TABLET | ORAL | 0 refills | Status: DC

## 2017-03-16 NOTE — Assessment & Plan Note (Signed)
Continue current medications. Continue to follow with endocrinology.

## 2017-03-16 NOTE — Assessment & Plan Note (Signed)
Patient with a fair amount of anxiety. Discussed potential treatments. We'll start on Celexa. We'll refer to the therapist. Recheck in 3 months.

## 2017-03-16 NOTE — Assessment & Plan Note (Signed)
Reports normal values at home. No changes made. CMP reviewed from recent endocrinology visit.

## 2017-03-16 NOTE — Progress Notes (Signed)
Michelle AlarEric Sharlie Shreffler, MD Phone: [phone]  Michelle Mckenzie is a 56 y.o. female who presents today for follow-up.  Hypertension: Has been running much improved at home. 113/68 on last check. Taking losartan. Had recent lab work through endocrinology. No chest pain, shortness breath, or edema.  Diabetes: Check last night and her sugar was 150 after dinner. Taking Victoza and metformin. Reports last A1c was 7.0. No polyuria or polydipsia. No hyperglycemia. Follows with endocrinology.  She notes lots of stress and anxiety. 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 No depression. No medication for this. She is interested in medication and seeing a therapist. She was previously been on Zoloft and did not respond to this.  She reports she is traveling to [place] at the beginning of July and is going to be there for a month. Her vaccine recommendations were for typhoid though this is a live vaccine and her dermatologist stated that she could not have this based on the medication she is on. On review of the CDC it also appears yellow fever and malaria are of concern. The yellow fever vaccine is a live vaccine as well and also appears to be in short supply. She is going to an endemic area for malaria. She also questions regarding altitude sickness. It appears that all the places she will be are less than 3000 feet in height.  Patient additionally reports that her ears feel like they're stopped up. She wonders if she has wax. No other upper respiratory symptoms.  PMH: nonsmoker.   ROS see history of present illness  Objective  Physical Exam Vitals:   03/16/17 0849  BP: 140/80  Pulse: 87  Temp: 98.7 F (37.1 C)    BP Readings from Last 3 Encounters:  03/16/17 140/80  11/07/16 (!) 146/89  10/24/16 (!) 164/94   Wt Readings from Last 3 Encounters:  03/16/17 167 lb (75.8 kg)  10/24/16 169 lb 3.2 oz (76.7 kg)  06/29/16 160 lb (72.6 kg)     Physical Exam  Constitutional: No distress.  HENT:  Right TM slightly obscured by cerumen though after irrigation appears normal, left TM normal  Cardiovascular: Normal rate, regular rhythm and normal heart sounds.   Pulmonary/Chest: Effort normal and breath sounds normal.  Musculoskeletal: She exhibits no edema.  Neurological: She is alert. Gait normal.  Skin: Skin is warm and dry. She is not diaphoretic.     Assessment/Plan: Please see individual problem list.  Hypertension Reports normal values at home. No changes made. CMP reviewed from recent endocrinology visit.  Allergic rhinitis Suspect ear symptoms are related to eustachian tube dysfunction. She should continue current allergy medications and monitor. If not improving refer to ENT.  Diabetes type 2, controlled Continue current medications. Continue to follow with endocrinology.  Anxiety Patient with a fair amount of anxiety. Discussed potential treatments. We'll start on Celexa. We'll refer to the therapist. Recheck in 3 months.  Travel advice encounter Patient reports she is traveling to [place] for a month. I reviewed the CDC website to determine appropriate treatment. She reports she was advised only thing she would need to be typhoid and appears also possibly yellow fever vaccine so these are live vaccines and she cannot get them given the other medication she is on. It appears that she is going to an area where she could be exposed to malaria. Recommended treatments include doxycycline for [place]. We will place her on doxycycline with initial  actions to start 1-2 days prior to leaving and to continue through the duration of her trip and 4 weeks after she returns. I did encouraged her to contact the health department in [facility] though she stated she would not have time to do this prior to leaving. Discussed given the relatively low altitude of the places that she is going and [place] it would be less likely for her to  have any issues with altitude sickness. Discussed possible symptoms and if she develops them she should be evaluated.  Patient reports intermittent issues with candidal intertrigo in the past. No symptoms now. Since she is going to be out of the country for a month she wondered if she could get a prescription for nystatin to try if needed. This was provided. Advised that if she develops symptoms that do not respond to nystatin she should be evaluated  Orders Placed This Encounter  Procedures  . Ambulatory referral to Psychology    Referral Priority:   Routine    Referral Type:   Psychiatric    Referral Reason:   Specialty Services Required    Requested Specialty:   Psychology    Number of Visits Requested:   1    Michelle Alar, MD [facility]

## 2017-03-16 NOTE — Assessment & Plan Note (Signed)
Suspect ear symptoms are related to eustachian tube dysfunction. She should continue current allergy medications and monitor. If not improving refer to ENT.

## 2017-03-16 NOTE — Patient Instructions (Signed)
Nice to see you. Please start monitoring her blood pressure consistently at home. If it is not consistently less than 130/80 please let us know. We will start her on Celexa for anxiety. We'll treat with doxycycline for prophylaxis for malaria while you are in [place]. You should take this once daily and start it 1-2 days prior to leaving. You will discontinue it 4 weeks after you are back in the [place].

## 2017-03-16 NOTE — Assessment & Plan Note (Addendum)
Patient reports she is traveling to [place] for a month. I reviewed the CDC website to determine appropriate treatment. She reports she was advised only thing she would need to be typhoid and appears also possibly yellow fever vaccine so these are live vaccines and she cannot get them given the other medication she is on. It appears that she is going to an area where she could be exposed to malaria. Recommended treatments include doxycycline for [place]. We will place her on doxycycline with initial actions to start 1-2 days prior to leaving and to continue through the duration of her trip and 4 weeks after she returns. I did encouraged her to contact the health department [place] though she stated she would not have time to do this prior to leaving. Discussed given the relatively low altitude of the places that she is going and [place] it would be less likely for her to have any issues with altitude sickness. Discussed possible symptoms and if she develops them she should be evaluated.

## 2017-03-21 ENCOUNTER — Telehealth: Payer: Self-pay | Admitting: Family Medicine

## 2017-03-21 NOTE — Telephone Encounter (Signed)
Please advise 

## 2017-03-21 NOTE — Telephone Encounter (Signed)
Disregard message, patient will go to [facility]

## 2017-03-21 NOTE — Telephone Encounter (Signed)
Pt called and wanted to know if she can get labs done that her dermatologist ordered. She needs a tb skin test, CBC, Hepatic function, Hepatitis C screening, Hepatitis B immunity panel,. Dermatologist wants these done due to a medication that he prescribes compromises her immune system. Please advise, thank you!  Call pt @ [phone]

## 2017-03-30 ENCOUNTER — Other Ambulatory Visit: Payer: Self-pay | Admitting: Family Medicine

## 2017-04-25 ENCOUNTER — Other Ambulatory Visit: Payer: Self-pay | Admitting: Family Medicine

## 2017-06-05 ENCOUNTER — Other Ambulatory Visit: Payer: Self-pay | Admitting: Obstetrics and Gynecology

## 2017-06-05 DIAGNOSIS — G4700 Insomnia, unspecified: Secondary | ICD-10-CM

## 2017-06-07 ENCOUNTER — Telehealth: Payer: Self-pay | Admitting: Obstetrics and Gynecology

## 2017-06-07 NOTE — Telephone Encounter (Signed)
Pt contacted office for refill request on the following medications:  zolpidem (AMBIEN) 10 MG tablet  Last Rx: 12/12/16 with 4 refills  [facility]  I wasn't sure if pt was due for Annual Exam b/c I don't have access to old system and pt wasn't sure either. Please advise. Thanks TNP

## 2017-06-07 NOTE — Telephone Encounter (Signed)
Pt past due for annual (last one 05/16/17). I will RF ambien once it's sched. Pls notify pt. Thx

## 2017-06-07 NOTE — Telephone Encounter (Signed)
Please get pt scheduled for annual and let ABC know once done so she can refill for pt. Thank you

## 2017-06-08 NOTE — Telephone Encounter (Signed)
Called pt no answer. LMTCB. Thanks TNP °

## 2017-06-14 ENCOUNTER — Other Ambulatory Visit: Payer: Self-pay | Admitting: Obstetrics and Gynecology

## 2017-06-14 DIAGNOSIS — G4700 Insomnia, unspecified: Secondary | ICD-10-CM

## 2017-06-14 MED ORDER — ZOLPIDEM TARTRATE 10 MG PO TABS: ORAL_TABLET | ORAL | 0 refills | Status: DC

## 2017-06-14 NOTE — Telephone Encounter (Signed)
Pt is schedule for annual 08/01/17 with Alicia Copland.

## 2017-06-14 NOTE — Telephone Encounter (Signed)
Done

## 2017-06-14 NOTE — Progress Notes (Signed)
Rx RF till annual 

## 2017-06-21 ENCOUNTER — Ambulatory Visit: Payer: BC Managed Care – PPO | Admitting: Family Medicine

## 2017-08-01 ENCOUNTER — Encounter: Payer: Self-pay | Admitting: Obstetrics and Gynecology

## 2017-08-01 ENCOUNTER — Ambulatory Visit (INDEPENDENT_AMBULATORY_CARE_PROVIDER_SITE_OTHER): Payer: BC Managed Care – PPO | Admitting: Obstetrics and Gynecology

## 2017-08-01 VITALS — BP 124/78 | Ht 63.0 in | Wt 172.0 lb

## 2017-08-01 DIAGNOSIS — G4700 Insomnia, unspecified: Secondary | ICD-10-CM

## 2017-08-01 DIAGNOSIS — Z01419 Encounter for gynecological examination (general) (routine) without abnormal findings: Secondary | ICD-10-CM | POA: Diagnosis not present

## 2017-08-01 DIAGNOSIS — Z1231 Encounter for screening mammogram for malignant neoplasm of breast: Secondary | ICD-10-CM

## 2017-08-01 DIAGNOSIS — Z1239 Encounter for other screening for malignant neoplasm of breast: Secondary | ICD-10-CM

## 2017-08-01 MED ORDER — ZOLPIDEM TARTRATE 10 MG PO TABS: ORAL_TABLET | ORAL | 5 refills | Status: DC

## 2017-08-01 NOTE — Patient Instructions (Signed)
I value your feedback and appreciate you entrusting us with your care. If you get a [place] patient survey, I would appreciate you taking the time to let us know what your experience was like. Thank you! 

## 2017-08-01 NOTE — Progress Notes (Signed)
PCP: Glori LuisSonnenberg, Eric G, MD   Chief Complaint  Patient presents with  . Gynecologic Exam    annual    HPI:      [name] is a 56 y.o. No obstetric history on file. who LMP was No LMP recorded. Patient is postmenopausal., presents today for her annual examination.  Her menses are absent due to menopause. She does not have intermenstrual bleeding. She does have vasomotor sx.   Sex activity: single partner, contraception - post menopausal status. She does not have vaginal dryness.  Last Pap: April 07, 2015  Results were: no abnormalities /neg HPV DNA.  Hx of STDs: none  Last mammogram: April 07, 2015  Results were: normal--routine follow-up in 12 months There is no FH of breast cancer. There is no FH of ovarian cancer. The patient does do self-breast exams.  Colonoscopy: colonoscopy 7 years ago without abnormalities.  Repeat due after 10 years.  DEXA: 2009--osteopenia in spine  Tobacco use: The patient denies current or previous tobacco use. Alcohol use: none Exercise: moderately active  She does get adequate calcium and Vitamin D in her diet.  She has labs with PCP.  She takes zolpidem 5 mg almost nightly due to stress and insomnia. She needs Rx RF. She tried going off it this summer but had to resume once school restarted.   Past Medical History:  Diagnosis Date  . Allergy   . Anxiety   . Asthma    No flares in 20 years  . Diabetes mellitus without complication (HCC)   . Insomnia     Past Surgical History:  Procedure Laterality Date  . CHOLECYSTECTOMY    . COLONOSCOPY  2010  . UPPER GASTROINTESTINAL ENDOSCOPY  2014    Family History  Problem Relation Age of Onset  . Cancer Father        prostate cancer  . Cancer Brother        colon cancer  . Arthritis Maternal Grandmother   . Stroke Maternal Grandmother   . Arthritis Maternal Grandfather        RA  . Arthritis Paternal Grandmother   . Heart disease Paternal Grandmother [id number]       CAD - MI -  Died  . Arthritis Paternal Grandfather     Social History   Socioeconomic History  . Marital status: Married    Spouse name: Herma CarsonBruce Schmader  . Number of children: 4  . Years of education: 5416  . Highest education level: Not on file  Social Needs  . Financial resource strain: Not on file  . Food insecurity - worry: Not on file  . Food insecurity - inability: Not on file  . Transportation needs - medical: Not on file  . Transportation needs - non-medical: Not on file  Occupational History  . 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 Smoking status: Never Smoker  . Smokeless tobacco: Never Used  Substance and Sexual Activity  . Alcohol use: Yes    Alcohol/week: 0.0 oz    Comment: Occasionally  . Drug use: No  . Sexual activity: Not Currently    Birth control/protection: Post-menopausal  Other Topics Concern  . Not on file  Social History Narrative   [name] [place] up in [place]. She attended [employer] and obtained her Bachelor's in [employer]. 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 She lives at home with her  husband. They have 4 children [name](Katherine [address], [name], [name], [name]). She spend time going to soccer games. She enjoys reading but her favorite thing to do is going to the beach.     No outpatient medications have been marked as taking for the 08/01/17 encounter (Office Visit) with Copland, Ilona SorrelAlicia B, PA-C.      ROS:  Review of Systems  Constitutional: Negative for fatigue, fever and unexpected weight change.  Respiratory: Negative for cough, shortness of breath and wheezing.   Cardiovascular: Negative for chest pain, palpitations and leg swelling.  Gastrointestinal: Negative for blood in stool, constipation, diarrhea, nausea and vomiting.  Endocrine: Negative for cold intolerance, heat intolerance and polyuria.  Genitourinary: Negative for dyspareunia,  dysuria, flank pain, frequency, genital sores, hematuria, menstrual problem, pelvic pain, urgency, vaginal bleeding, vaginal discharge and vaginal pain.  Musculoskeletal: Negative for back pain, joint swelling and myalgias.  Skin: Negative for rash.  Neurological: Negative for dizziness, syncope, light-headedness, numbness and headaches.  Hematological: Negative for adenopathy.  Psychiatric/Behavioral: Negative for agitation, confusion, sleep disturbance and suicidal ideas. The patient is not nervous/anxious.      Objective: BP 124/78   Ht 5\' 3"  (1.6 m)   Wt 172 lb (78 kg)   BMI 30.47 kg/m    Physical Exam  Constitutional: She is oriented to person, place, and time. She appears well-developed and well-nourished.  Genitourinary: Vagina normal and uterus normal. There is no rash or tenderness on the right labia. There is no rash or tenderness on the left labia. No erythema or tenderness in the vagina. No vaginal discharge found. Right adnexum does not display mass and does not display tenderness. Left adnexum does not display mass and does not display tenderness. Cervix does not exhibit motion tenderness or polyp. Uterus is not enlarged or tender.  Neck: Normal range of motion. No thyromegaly present.  Cardiovascular: Normal rate, regular rhythm and normal heart sounds.  No murmur heard. Pulmonary/Chest: Effort normal and breath sounds normal. Right breast exhibits no mass, no nipple discharge, no skin change and no tenderness. Left breast exhibits no mass, no nipple discharge, no skin change and no tenderness.  Abdominal: Soft. There is no tenderness. There is no guarding.  Musculoskeletal: Normal range of motion.  Neurological: She is alert and oriented to person, place, and time. No cranial nerve deficit.  Psychiatric: She has a normal mood and affect. Her behavior is normal.  Vitals reviewed.   Assessment/Plan:  Encounter for annual routine gynecological examination  Screening  for breast cancer - Pt to sched mammo. - Plan: MM DIGITAL SCREENING BILATERAL  Insomnia, unspecified type - Rx RF zolpidem. F/u prn.  - Plan: zolpidem (AMBIEN) 10 MG tablet  Insomnia, unspecified type - Plan: zolpidem (AMBIEN) 10 MG tablet   Meds ordered this encounter  Medications  . zolpidem (AMBIEN) 10 MG tablet    Sig: Take 1/2-1 tablet at bedtime    Dispense:  30 tablet    Refill:  5          GYN counsel mammography screening, adequate intake of calcium and vitamin D, diet and exercise    F/U  Return in about 1 year (around 08/01/2018).  Alicia B. Copland, PA-C 08/01/2017 4:56 PM

## 2017-09-08 ENCOUNTER — Other Ambulatory Visit: Payer: Self-pay | Admitting: Family Medicine

## 2017-10-02 ENCOUNTER — Encounter: Payer: BC Managed Care – PPO | Admitting: General Surgery

## 2017-10-03 DIAGNOSIS — E782 Mixed hyperlipidemia: Secondary | ICD-10-CM | POA: Insufficient documentation

## 2017-10-03 DIAGNOSIS — E1169 Type 2 diabetes mellitus with other specified complication: Secondary | ICD-10-CM | POA: Insufficient documentation

## 2017-10-03 DIAGNOSIS — E785 Hyperlipidemia, unspecified: Secondary | ICD-10-CM | POA: Insufficient documentation

## 2017-10-03 DIAGNOSIS — F411 Generalized anxiety disorder: Secondary | ICD-10-CM | POA: Insufficient documentation

## 2017-10-10 ENCOUNTER — Ambulatory Visit: Payer: BC Managed Care – PPO | Admitting: Family Medicine

## 2017-10-19 ENCOUNTER — Telehealth: Payer: Self-pay | Admitting: Obstetrics and Gynecology

## 2017-10-19 ENCOUNTER — Other Ambulatory Visit: Payer: Self-pay | Admitting: Obstetrics and Gynecology

## 2017-10-19 ENCOUNTER — Telehealth: Payer: Self-pay

## 2017-10-19 DIAGNOSIS — R928 Other abnormal and inconclusive findings on diagnostic imaging of breast: Secondary | ICD-10-CM

## 2017-10-19 NOTE — Telephone Encounter (Signed)
Pls call pt to discuss ref to [facility] vs [facility]. I can do orders prn.

## 2017-10-19 NOTE — Telephone Encounter (Signed)
Discussed Birads 4 mammo RT breast. Pt [facility] to hear from [facility] re: bx appt. Pt's questions answered.

## 2017-10-19 NOTE — Telephone Encounter (Signed)
Pt spoke c [facility] this am.  The [facility] 2/6th.  Pt wants to know if we can check c [facility] and see how soon they can get her in for needle bx and let her know.  [phone]

## 2017-10-19 NOTE — Progress Notes (Signed)
Gen surg ref for birads [facility].

## 2017-10-23 ENCOUNTER — Ambulatory Visit: Payer: BC Managed Care – PPO | Admitting: General Surgery

## 2017-10-23 ENCOUNTER — Encounter: Payer: Self-pay | Admitting: General Surgery

## 2017-10-23 VITALS — BP 130/70 | HR 97 | Resp 12 | Ht 64.0 in | Wt 165.0 lb

## 2017-10-23 DIAGNOSIS — Z800 Family history of malignant neoplasm of digestive organs: Secondary | ICD-10-CM | POA: Diagnosis not present

## 2017-10-23 DIAGNOSIS — R920 Mammographic microcalcification found on diagnostic imaging of breast: Secondary | ICD-10-CM | POA: Diagnosis not present

## 2017-10-23 NOTE — Patient Instructions (Signed)
Patient to have a right breast stereo biopsy. The patient is aware to call back for any questions or concerns.

## 2017-10-23 NOTE — Progress Notes (Signed)
Patient ID: Michelle Mckenzie, female   DOB: 02/12/1961, 57 y.o.   MRN: [id number]  Chief Complaint  Patient presents with  . Other    HPI Michelle Mckenzie is a 57 y.o. female who presents for a breast evaluation. The most recent mammogram was done on  10/03/2017 and added views on 10/18/2017.  Patient does perform regular self breast checks and gets regular mammograms done.   Husband, Michelle Mckenzie is present at visit.   HPI  Past Medical History:  Diagnosis Date  . Allergy   . Anxiety   . Asthma    No flares in 20 years  . Diabetes mellitus without complication (HCC)   . Hypertension   . Insomnia     Past Surgical History:  Procedure Laterality Date  . CHOLECYSTECTOMY    . COLONOSCOPY  12/01/2009   Normal, Lutricia FeilPaul Oh, MD MI: 5-year follow-up exam recommended based on family history.  Marland Kitchen. UPPER GASTROINTESTINAL ENDOSCOPY  2014    Family History  Problem Relation Age of Onset  . Cancer Father        prostate cancer  . Cancer Brother        colon cancer age 436  . Arthritis Maternal Grandmother   . Stroke Maternal Grandmother   . Arthritis Maternal Grandfather        RA  . Arthritis Paternal Grandmother   . Heart disease Paternal Grandmother [id number]       CAD - MI - Died  . Arthritis Paternal Grandfather     Social History Social History   Tobacco Use  . Smoking status: Never Smoker  . Smokeless tobacco: Never Used  Substance Use Topics  . Alcohol use: Yes    Alcohol/week: 0.0 oz    Comment: Occasionally  . Drug use: No    Allergies  Allergen Reactions  . Lanolin Rash    Current Outpatient Medications  Medication Sig Dispense Refill  . cetirizine (ZYRTEC) 10 MG tablet Take 10 mg by mouth daily.    . citalopram (CELEXA) 10 MG tablet TAKE 1 TABLET BY MOUTH EVERY DAY 90 tablet 1  . colestipol (COLESTID) 1 g tablet TAKE 1 TABLET BY MOUTH TWICE A DAY 60 tablet 2  . Dermatological Products, Misc. (ELETONE) CREA     . fluticasone (FLONASE) 50 MCG/ACT nasal spray PLACE 2  SPRAYS INTO BOTH NOSTRILS DAILY. 16 g 11  . hydrOXYzine (ATARAX/VISTARIL) 25 MG tablet Take 25 mg by mouth at bedtime.     . liraglutide (VICTOZA) 18 MG/3ML SOPN INJECT 0.3 ML (1.8MG ) SUBCUTANEOUSLY DAILY AT SUPPER TIME OR BEDTIME. ROTATE INJECTION SITES    . losartan (COZAAR) 50 MG tablet Take 1 tablet (50 mg total) by mouth daily. 90 tablet 3  . metformin (FORTAMET) 1000 MG (OSM) 24 hr tablet Take 2,000 mg by mouth at bedtime.     . montelukast (SINGULAIR) 10 MG tablet TAKE 1 TABLET (10 MG TOTAL) BY MOUTH AT BEDTIME. 30 tablet 3  . Multiple Vitamin (MULTI-VITAMINS) TABS Take by mouth.    . NOVOTWIST 32G X 5 MM MISC     . Secukinumab (COSENTYX [place]) Inject into the skin.    . Vitamin D, Ergocalciferol, (DRISDOL) 50000 units CAPS capsule Take by mouth.    . zolpidem (AMBIEN) 10 MG tablet Take 1/2-1 tablet at bedtime 30 tablet 5   No current facility-administered medications for this visit.     Review of Systems Review of Systems  Constitutional: Negative.   Respiratory: Negative.   Cardiovascular:  Negative.     Blood pressure 130/70, pulse 97, resp. rate 12, height 5\' 4"  (1.626 m), weight 165 lb (74.8 kg).  Physical Exam Physical Exam  Constitutional: She is oriented to person, place, and time. She appears well-developed and well-nourished.  Eyes: Conjunctivae are normal. No scleral icterus.  Neck: Neck supple.  Cardiovascular: Normal rate, regular rhythm and normal heart sounds.  Pulmonary/Chest: Effort normal and breath sounds normal. Right breast exhibits no inverted nipple, no mass, no nipple discharge, no skin change and no tenderness. Left breast exhibits no inverted nipple, no mass, no nipple discharge, no skin change and no tenderness. Breasts are asymmetrical (right breast bigger than left breast.).  Right breast about 10% larger than left.  Lymphadenopathy:    She has no cervical adenopathy.    She has no axillary adenopathy.  Neurological: She is alert and oriented to  person, place, and time.  Skin: Skin is warm and dry.    Data Reviewed June 2016 bilateral screening mammograms completed at [facility] reviewed.  Coarse calcification in the deep central portion of the right breast.    January 3 and October 04, 2017 screening and right breast diagnostic mammograms completed at [facility] reviewed.  New cluster of moderately coarse calcifications in the upper outer quadrant of the right breast new from 2016.  Indeterminate, BI-RADS-4 B.  Very dense breasts.  Colonoscopy report of March 2011 reviewed: Normal.  5-year follow-up recommended. The patient reports that her brother underwent genetic testing and was told he did not have any genetic abnormality.  Contact with the GI department was completed during the patient's visit, no change in recommendation for 5-year follow-up based on first-degree relative alone.   Assessment    New cluster of indeterminate microcalcifications in the upper outer quadrant of the right breast.    Plan    These are new since the last mammogram 2-1/2 years ago.  Stereotactic biopsy had been recommended by the radiologist and is quite appropriate to put the issue to rest.  Risks associated with the procedure including bleeding and infection, both were reviewed.  If scheduling difficulties are encountered the possibility of having the radiologist complete this study was discussed.  The patient will discuss with her primary care physician the advisability of arranging for a repeat colonoscopy as she is slightly overdue.  This can be completed with the GI department or through this office.     Patient to have a right breast stereo biopsy. The patient is aware to call back for any questions or concerns.   HPI, Physical Exam, Assessment and Plan have been scribed under the direction and in the presence of Donnalee Curry, MD.  Ples Specter, CMA  I have completed the exam and reviewed the above documentation for  accuracy and completeness.  I agree with the above.  Museum/gallery conservator has been used and any errors in dictation or transcription are unintentional.  Donnalee Curry, M.D., F.A.C.S.   Merrily Pew Rees Matura 10/23/2017, 4:31 PM  A message has been sent to [facility] at the [facility] to see if the mammography department will allow [place] to have a stereo slot prior to Monday, 11-20-17 which is Dr. Rutherford Nail next scheduled day for stereos since he is on vacation on 11-06-17. If not, the patient wants to wait until 11-20-17 for Dr. Lemar Livings to complete the right stereo biopsy.   Patient will be contacted once we hear from the staff at [facility]   Nicholes Mango, CMA

## 2017-10-24 ENCOUNTER — Ambulatory Visit: Payer: BC Managed Care – PPO | Admitting: General Surgery

## 2017-10-25 ENCOUNTER — Encounter: Payer: Self-pay | Admitting: *Deleted

## 2017-10-25 ENCOUNTER — Other Ambulatory Visit: Payer: Self-pay | Admitting: *Deleted

## 2017-10-25 DIAGNOSIS — R920 Mammographic microcalcification found on diagnostic imaging of breast: Secondary | ICD-10-CM

## 2017-10-26 ENCOUNTER — Other Ambulatory Visit: Payer: Self-pay | Admitting: Family Medicine

## 2017-10-30 ENCOUNTER — Encounter: Payer: Self-pay | Admitting: *Deleted

## 2017-11-01 ENCOUNTER — Encounter: Payer: Self-pay | Admitting: Obstetrics & Gynecology

## 2017-11-01 ENCOUNTER — Encounter: Payer: Self-pay | Admitting: *Deleted

## 2017-11-01 ENCOUNTER — Other Ambulatory Visit: Payer: Self-pay | Admitting: General Surgery

## 2017-11-01 ENCOUNTER — Encounter: Payer: Self-pay | Admitting: Obstetrics and Gynecology

## 2017-11-01 DIAGNOSIS — R920 Mammographic microcalcification found on diagnostic imaging of breast: Secondary | ICD-10-CM

## 2017-11-07 ENCOUNTER — Other Ambulatory Visit: Payer: Self-pay | Admitting: Family Medicine

## 2017-11-20 ENCOUNTER — Ambulatory Visit
Admission: RE | Admit: 2017-11-20 | Discharge: 2017-11-20 | Disposition: A | Discharge status: Home / Self Care | Payer: BC Managed Care – PPO | Source: Ambulatory Visit | Attending: General Surgery | Admitting: General Surgery

## 2017-11-20 DIAGNOSIS — R920 Mammographic microcalcification found on diagnostic imaging of breast: Secondary | ICD-10-CM

## 2017-11-20 HISTORY — PX: BREAST BIOPSY: SHX20

## 2017-11-21 ENCOUNTER — Telehealth: Payer: Self-pay | Admitting: *Deleted

## 2017-11-21 LAB — SURGICAL PATHOLOGY

## 2017-11-21 NOTE — Telephone Encounter (Signed)
Notified patient that pathology was good-no cancer per Dr [facility]ar [facility]. She would like me to cal her back with details when available.

## 2017-12-02 ENCOUNTER — Other Ambulatory Visit: Payer: Self-pay | Admitting: Family Medicine

## 2017-12-27 ENCOUNTER — Other Ambulatory Visit: Payer: Self-pay | Admitting: Family Medicine

## 2018-01-01 ENCOUNTER — Encounter: Payer: Self-pay | Admitting: Obstetrics and Gynecology

## 2018-01-19 ENCOUNTER — Encounter: Payer: Self-pay | Admitting: Obstetrics and Gynecology

## 2018-01-30 ENCOUNTER — Other Ambulatory Visit: Payer: Self-pay | Admitting: Obstetrics and Gynecology

## 2018-01-30 DIAGNOSIS — G4700 Insomnia, unspecified: Secondary | ICD-10-CM

## 2018-01-31 ENCOUNTER — Other Ambulatory Visit: Payer: Self-pay | Admitting: Obstetrics and Gynecology

## 2018-01-31 DIAGNOSIS — G4700 Insomnia, unspecified: Secondary | ICD-10-CM

## 2018-01-31 NOTE — Telephone Encounter (Signed)
Please advise. Thank you

## 2018-02-02 DIAGNOSIS — Z7185 Encounter for immunization safety counseling: Secondary | ICD-10-CM | POA: Insufficient documentation

## 2018-02-02 DIAGNOSIS — Z7189 Other specified counseling: Secondary | ICD-10-CM | POA: Insufficient documentation

## 2018-03-07 ENCOUNTER — Other Ambulatory Visit: Payer: Self-pay | Admitting: Family Medicine

## 2018-07-25 ENCOUNTER — Telehealth: Payer: Self-pay

## 2018-07-25 ENCOUNTER — Other Ambulatory Visit: Payer: Self-pay | Admitting: Obstetrics and Gynecology

## 2018-07-25 DIAGNOSIS — G4700 Insomnia, unspecified: Secondary | ICD-10-CM

## 2018-07-25 MED ORDER — ZOLPIDEM TARTRATE 10 MG PO TABS: ORAL_TABLET | ORAL | 1 refills | Status: DC

## 2018-07-25 NOTE — Telephone Encounter (Signed)
Pt calling needing a refill of Zolpidem she has scheduled her annual for December

## 2018-07-25 NOTE — Progress Notes (Signed)
Rx RF till annual 

## 2018-07-25 NOTE — Telephone Encounter (Signed)
RN to fax Rx RF.

## 2018-08-27 ENCOUNTER — Ambulatory Visit (INDEPENDENT_AMBULATORY_CARE_PROVIDER_SITE_OTHER): Payer: BC Managed Care – PPO | Admitting: Obstetrics and Gynecology

## 2018-08-27 ENCOUNTER — Encounter: Payer: Self-pay | Admitting: Obstetrics and Gynecology

## 2018-08-27 ENCOUNTER — Other Ambulatory Visit (HOSPITAL_COMMUNITY)
Admission: RE | Admit: 2018-08-27 | Discharge: 2018-08-27 | Disposition: A | Discharge status: Home / Self Care | Payer: BC Managed Care – PPO | Source: Ambulatory Visit | Attending: Obstetrics and Gynecology | Admitting: Obstetrics and Gynecology

## 2018-08-27 VITALS — BP 138/80 | HR 81 | Ht 63.0 in | Wt 171.0 lb

## 2018-08-27 DIAGNOSIS — Z124 Encounter for screening for malignant neoplasm of cervix: Secondary | ICD-10-CM | POA: Diagnosis not present

## 2018-08-27 DIAGNOSIS — G4700 Insomnia, unspecified: Secondary | ICD-10-CM

## 2018-08-27 DIAGNOSIS — Z1151 Encounter for screening for human papillomavirus (HPV): Secondary | ICD-10-CM | POA: Insufficient documentation

## 2018-08-27 DIAGNOSIS — R2231 Localized swelling, mass and lump, right upper limb: Secondary | ICD-10-CM

## 2018-08-27 DIAGNOSIS — Z1239 Encounter for other screening for malignant neoplasm of breast: Secondary | ICD-10-CM

## 2018-08-27 DIAGNOSIS — E119 Type 2 diabetes mellitus without complications: Secondary | ICD-10-CM

## 2018-08-27 DIAGNOSIS — E039 Hypothyroidism, unspecified: Secondary | ICD-10-CM

## 2018-08-27 DIAGNOSIS — Z01419 Encounter for gynecological examination (general) (routine) without abnormal findings: Secondary | ICD-10-CM

## 2018-08-27 MED ORDER — ZOLPIDEM TARTRATE 10 MG PO TABS: ORAL_TABLET | ORAL | 5 refills | Status: DC

## 2018-08-27 NOTE — Patient Instructions (Signed)
I value your feedback and entrusting us with your care. If you get a [place] patient survey, I would appreciate you taking the time to let us know about your experience today. Thank you!  [facility]: [phone]   [facility]: [phone]  

## 2018-08-27 NOTE — Progress Notes (Signed)
PCP: Michelle Ivan, MD   Chief Complaint  Patient presents with  . Gynecologic Exam    HPI:      [name] is a 57 y.o. No obstetric history on file. who LMP was No LMP recorded. Patient is postmenopausal., presents today for her annual examination.  Her menses are absent due to menopause. She does not have intermenstrual bleeding. She does have occas vasomotor sx.   Sex activity: single partner, contraception - post menopausal status. She does not have vaginal dryness.  Last Pap: April 07, 2015  Results were: no abnormalities /neg HPV DNA.  Hx of STDs: none  Last mammogram: 10/18/17 Results were: Birads 4A RT breast. Pt had bx with Dr. Lemar Mckenzie with fibroadenomatous changes. Pt due for yearly mammo 09/2018.  Pt with RT axillary mass for many yrs. Mass is getting larger now and more tender this yr.  There is no FH of breast cancer. There is no FH of ovarian cancer. The patient does do self-breast exams.  Colonoscopy: colonoscopy 8 years ago without abnormalities.  Repeat due after 10 years. There is FH of colon cancer in her brother.  DEXA: 2009--osteopenia in spine  Tobacco use: The patient denies current or previous tobacco use. Alcohol use: none Exercise: moderately active  She does get adequate calcium and Vitamin D in her diet.  She has labs with PCP. Pt has a hx of type 2 DM and hypothyroidism. She was followed by endocrine, Dr. Renae Mckenzie, who left. Pt was trying to manage with PCP but would prefer to see endocrine again. Needs ref.   She takes zolpidem 5 mg almost nightly due to stress and insomnia. She needs Rx RF. She tried going off in the summer but had to resume once school restarted.   Past Medical History:  Diagnosis Date  . Allergy   . Anxiety   . Asthma    No flares in 20 years  . Diabetes mellitus without complication (HCC)   . Hypercholesterolemia   . Hypertension   . Insomnia   . Osteopenia 11/19/2007   per dexa  . PMS (premenstrual syndrome)    . Psoriasis   . Vitamin D deficiency 01/2013   per PCP;he wants [place] to manage    Past Surgical History:  Procedure Laterality Date  . BREAST BIOPSY Right 11/20/2017   right breast stereo with calcs path pending  . CHOLECYSTECTOMY    . COLONOSCOPY  12/01/2009   Normal, Michelle Feil, MD MI: 5-year follow-up exam recommended based on family history.  . COLPOSCOPY  2010  . TUBAL LIGATION  2001  . UPPER GASTROINTESTINAL ENDOSCOPY  2014    Family History  Problem Relation Age of Onset  . Cancer Father        prostate cancer  . Cancer Brother        colon cancer age 35  . Arthritis Maternal Grandmother   . Stroke Maternal Grandmother   . Arthritis Maternal Grandfather        RA  . Arthritis Paternal Grandmother   . Heart disease Paternal Grandmother 83       CAD - MI - Died  . Arthritis Paternal Grandfather     Social History   Socioeconomic History  . Marital status: Married    Spouse name: Michelle Mckenzie  . Number of children: 4  . Years of education: 55  . Highest education level: Not on file  Occupational History  . 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 Financial resource strain: Not on file  . Food insecurity:    Worry: Not on file    Inability: Not on file  . Transportation needs:    Medical: Not on file    Non-medical: Not on file  Tobacco Use  . Smoking status: Never Smoker  . Smokeless tobacco: Never Used  Substance and Sexual Activity  . Alcohol use: Yes    Alcohol/week: 0.0 standard drinks    Comment: Occasionally  . Drug use: No  . Sexual activity: Yes    Birth control/protection: Post-menopausal  Lifestyle  . Physical activity:    Days per week: Not on file    Minutes per session: Not on file  . Stress: Not on file  Relationships  . Social connections:    Talks on phone: Not on file    Gets together: Not on file    Attends religious service: Not on file    Active member of club or  organization: Not on file    Attends meetings of clubs or organizations: Not on file    Relationship status: Not on file  . Intimate partner violence:    Fear of current or ex partner: Not on file    Emotionally abused: Not on file    Physically abused: Not on file    Forced sexual activity: Not on file  Other Topics Concern  . Not on file  Social History Narrative   Michelle Mckenzie [place] She attended [employer] and obtained her Bachelor's in [employer]. 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 She lives at home with her husband. They have 4 children Michelle Mckenzie(Michelle Mckenzie, Michelle Mckenzie, Michelle Mckenzie, Urugu[place]ieanna Mckenzie). She spend time going to soccer games. She enjoys reading but her favorite thing to do is going to the beach.     Current Meds  Medication Sig  . cetirizine (ZYRTEC) 10 MG tablet Take 10 mg by mouth daily.  . citalopram (CELEXA) 10 MG tablet TAKE 1 TABLET BY MOUTH EVERY DAY  . colestipol (COLESTID) 1 g tablet TAKE 1 TABLET BY MOUTH TWICE A DAY  . Continuous Blood Gluc Sensor (FREESTYLE LIBRE 14 DAY SENSOR) MISC Use 1 each every 14 (fourteen) days E11.65  . empagliflozin (JARDIANCE) 10 MG TABS tablet Take by mouth.  . fluticasone (FLONASE) 50 MCG/ACT nasal spray PLACE 2 SPRAYS INTO BOTH NOSTRILS DAILY.  . hydrOXYzine (ATARAX/VISTARIL) 25 MG tablet Take 25 mg by mouth at bedtime.   . Insulin Pen Needle (BD PEN NEEDLE NANO U/F) 32G X 4 MM MISC USE ONE NEEDLE ONCE A DAY AS DIRECTED WITH VICTOZA PEN  . liraglutide (VICTOZA) 18 MG/3ML SOPN INJECT 0.3 ML (1.8MG ) SUBCUTANEOUSLY DAILY AT SUPPER TIME OR BEDTIME. ROTATE INJECTION SITES  . losartan (COZAAR) 50 MG tablet Take 1 tablet (50 mg total) by mouth daily.  Marland Kitchen. lovastatin (MEVACOR) 40 MG tablet Take by mouth.  . Melatonin 10 MG TABS Take by mouth.  . metFORMIN (GLUCOPHAGE-XR) 500 MG 24 hr tablet Take 1,000 mg by mouth 2 (two) times daily.  . montelukast (SINGULAIR) 10 MG tablet TAKE 1 TABLET (10  MG TOTAL) BY MOUTH AT BEDTIME.  . Multiple Vitamin (MULTI-VITAMINS) TABS Take by mouth.  . NOVOTWIST 32G X 5 MM MISC   . ONETOUCH DELICA LANCETS 33G MISC USE TO TEST BLOOD SUGAR TWICE A DAY AS DIRECTED  . Secukinumab (COSENTYX [place]) Inject into the skin.  .Marland Kitchen  Vitamin D, Ergocalciferol, (DRISDOL) 1.25 MG (50000 UT) CAPS capsule Take by mouth.  . zolpidem (AMBIEN) 10 MG tablet TAKE 1/2 TO 1 TABLET BY MOUTH AT BEDTIME  . [DISCONTINUED] zolpidem (AMBIEN) 10 MG tablet TAKE 1/2 TO 1 TABLET BY MOUTH AT BEDTIME      ROS:  Review of Systems  Constitutional: Negative for fatigue, fever and unexpected weight change.  Respiratory: Negative for cough, shortness of breath and wheezing.   Cardiovascular: Negative for chest pain, palpitations and leg swelling.  Gastrointestinal: Negative for blood in stool, constipation, diarrhea, nausea and vomiting.  Endocrine: Negative for cold intolerance, heat intolerance and polyuria.  Genitourinary: Negative for dyspareunia, dysuria, flank pain, frequency, genital sores, hematuria, menstrual problem, pelvic pain, urgency, vaginal bleeding, vaginal discharge and vaginal pain.  Musculoskeletal: Negative for back pain, joint swelling and myalgias.  Skin: Negative for rash.  Neurological: Negative for dizziness, syncope, light-headedness, numbness and headaches.  Hematological: Negative for adenopathy.  Psychiatric/Behavioral: Negative for agitation, confusion, sleep disturbance and suicidal ideas. The patient is not nervous/anxious.      Objective: BP 138/80   Pulse 81   Ht 5\' 3"  (1.6 m)   Wt 171 lb (77.6 kg)   BMI 30.29 kg/m    Physical Exam  Constitutional: She is oriented to person, place, and time. She appears well-developed and well-nourished.  Genitourinary: Vagina normal and uterus normal. There is no rash or tenderness on the right labia. There is no rash or tenderness on the left labia. No erythema or tenderness in the vagina. No vaginal discharge  found. Right adnexum does not display mass and does not display tenderness. Left adnexum does not display mass and does not display tenderness. Cervix does not exhibit motion tenderness or polyp. Uterus is not enlarged or tender.  Neck: Normal range of motion. No thyromegaly present.  Cardiovascular: Normal rate, regular rhythm and normal heart sounds.  No murmur heard. Pulmonary/Chest: Effort normal and breath sounds normal. Right breast exhibits no mass, no nipple discharge, no skin change and no tenderness. Left breast exhibits no mass, no nipple discharge, no skin change and no tenderness.    Abdominal: Soft. There is no tenderness. There is no guarding.  Musculoskeletal: Normal range of motion.  Lymphadenopathy:    She has no axillary adenopathy.  Neurological: She is alert and oriented to person, place, and time. No cranial nerve deficit.  Psychiatric: She has a normal mood and affect. Her behavior is normal.  Vitals reviewed.   Assessment/Plan:  Encounter for annual routine gynecological examination  Cervical cancer screening - Plan: Cytology - PAP  Screening for HPV (human papillomavirus) - Plan: Cytology - PAP  Screening for breast cancer - Pt to sched mammo - Plan: MM 3D SCREEN BREAST BILATERAL  Insomnia, unspecified type - Rx RF zolpidem. - Plan: zolpidem (AMBIEN) 10 MG tablet  Mass of right axilla - Question breast tissue vs other. Refer to gen surg for eval/possible mgmt. - Plan: Ambulatory referral to General Surgery  Controlled type 2 diabetes mellitus without complication, without long-term current use of insulin (HCC) - Refer to endocrine for better mgmt. Pt's endocrinologist left. - Plan: Ambulatory referral to Endocrinology  Acquired hypothyroidism - Refer to endocrine for better mgmt. Pt's endocrine left area. - Plan: Ambulatory referral to Endocrinology  Insomnia, unspecified type - Plan: zolpidem (AMBIEN) 10 MG tablet   Meds ordered this encounter    Medications  . zolpidem (AMBIEN) 10 MG tablet    Sig: TAKE 1/2 TO 1 TABLET BY  MOUTH AT BEDTIME    Dispense:  30 tablet    Refill:  5    This request is for a new prescription for a controlled substance as required by Federal/State law..    Order Specific Question:   Supervising Provider    Answer:   Nadara Mustard [[id number]]          GYN counsel mammography screening, adequate intake of calcium and vitamin D, diet and exercise    F/U  Return in about 1 year (around 08/28/2019).  Alicia B. Copland, PA-C 08/27/2018 3:44 PM

## 2018-08-29 LAB — CYTOLOGY - PAP
Diagnosis: NEGATIVE
HPV (WINDOPATH): NOT DETECTED

## 2018-09-04 ENCOUNTER — Other Ambulatory Visit: Payer: Self-pay

## 2018-09-04 ENCOUNTER — Encounter: Payer: Self-pay | Admitting: General Surgery

## 2018-09-04 ENCOUNTER — Ambulatory Visit: Payer: BC Managed Care – PPO | Admitting: General Surgery

## 2018-09-04 VITALS — BP 124/85 | HR 91 | Temp 97.3°F | Resp 16 | Ht 63.0 in | Wt 169.4 lb

## 2018-09-04 DIAGNOSIS — R2231 Localized swelling, mass and lump, right upper limb: Secondary | ICD-10-CM | POA: Diagnosis not present

## 2018-09-04 NOTE — Patient Instructions (Addendum)
We will schedule your surgery.  The patient is scheduled for surgery with Dr Lemar [facility]ingsBy[facility]nett at [facility] on 09/10/18. She will pre admit at the hospital and we will call her with her pre admit arrival date and time. The patient is aware of date and instructions.

## 2018-09-04 NOTE — Progress Notes (Signed)
Patient ID: Michelle Mckenzie, female   DOB: [birth year], 57 y.o.   MRN: [id number]  Chief Complaint  Patient presents with  . Follow-up    Right axillary mass    HPI Michelle Mckenzie is a 57 y.o. female.  Here today right axillary mass. She states she has had it for 29 years. Increasing in size and sore.  The patient reports that in years past, especially during pregnancy the area will become more prominent, but over the last year has become markedly so. Last mammogram was 09/2017.She states she does self breast exams and she recently had a breast exam by her gynecologist.  HPI  Past Medical History:  Diagnosis Date  . Allergy   . Anxiety   . Asthma    No flares in 20 years  . Diabetes mellitus without complication (HCC)   . Hypercholesterolemia   . Hypertension   . Insomnia   . Osteopenia 11/19/2007   per dexa  . PMS (premenstrual syndrome)   . Psoriasis   . Vitamin D deficiency 01/2013   per PCP;he wants [place] to manage    Past Surgical History:  Procedure Laterality Date  . BREAST BIOPSY Right 11/20/2017   right breast stereo with calcs path pending  . CHOLECYSTECTOMY    . COLONOSCOPY  12/01/2009   Normal, [name], MD MI: 5-year follow-up exam recommended based on family history.  . COLPOSCOPY  2010  . TUBAL LIGATION  2001  . UPPER GASTROINTESTINAL ENDOSCOPY  2014    Family History  Problem Relation Age of Onset  . Cancer Father        prostate cancer  . Cancer Brother        colon cancer age 27  . Arthritis Maternal Grandmother   . Stroke Maternal Grandmother   . Arthritis Maternal Grandfather        RA  . Arthritis Paternal Grandmother   . Heart disease Paternal Grandmother 13       CAD - MI - Died  . Arthritis Paternal Grandfather     Social History Social History   Tobacco Use  . Smoking status: Never Smoker  . Smokeless tobacco: Never Used  Substance Use Topics  . Alcohol use: Yes    Alcohol/week: 0.0 standard drinks    Comment: Occasionally  .  Drug use: No    Allergies  Allergen Reactions  . Lanolin Rash    Current Outpatient Medications  Medication Sig Dispense Refill  . cetirizine (ZYRTEC) 10 MG tablet Take 10 mg by mouth daily.    . citalopram (CELEXA) 10 MG tablet TAKE 1 TABLET BY MOUTH EVERY DAY 90 tablet 1  . colestipol (COLESTID) 1 g tablet TAKE 1 TABLET BY MOUTH TWICE A DAY 60 tablet 2  . Continuous Blood Gluc Sensor (FREESTYLE LIBRE 14 DAY SENSOR) MISC Use 1 each every 14 (fourteen) days E11.65    . empagliflozin (JARDIANCE) 10 MG TABS tablet Take by mouth.    . fluticasone (FLONASE) 50 MCG/ACT nasal spray PLACE 2 SPRAYS INTO BOTH NOSTRILS DAILY. 16 g 11  . hydrOXYzine (ATARAX/VISTARIL) 25 MG tablet Take 25 mg by mouth at bedtime.     . Insulin Pen Needle (BD PEN NEEDLE NANO U/F) 32G X 4 MM MISC USE ONE NEEDLE ONCE A DAY AS DIRECTED WITH VICTOZA PEN    . liraglutide (VICTOZA) 18 MG/3ML SOPN INJECT 0.3 ML (1.8MG ) SUBCUTANEOUSLY DAILY AT SUPPER TIME OR BEDTIME. ROTATE INJECTION SITES    . losartan (COZAAR) 50  MG tablet Take 1 tablet (50 mg total) by mouth daily. 90 tablet 3  . lovastatin (MEVACOR) 40 MG tablet Take by mouth.    . Melatonin 10 MG TABS Take by mouth.    . metFORMIN (GLUCOPHAGE-XR) 500 MG 24 hr tablet Take 1,000 mg by mouth 2 (two) times daily.  1  . montelukast (SINGULAIR) 10 MG tablet TAKE 1 TABLET (10 MG TOTAL) BY MOUTH AT BEDTIME. 30 tablet 3  . Multiple Vitamin (MULTI-VITAMINS) TABS Take by mouth.    . NOVOTWIST 32G X 5 MM MISC     . ONETOUCH DELICA LANCETS 33G MISC USE TO TEST BLOOD SUGAR TWICE A DAY AS DIRECTED    . Secukinumab (COSENTYX [place]) Inject into the skin.    . Vitamin D, Ergocalciferol, (DRISDOL) 1.25 MG (50000 UT) CAPS capsule Take by mouth.    . zolpidem (AMBIEN) 10 MG tablet TAKE 1/2 TO 1 TABLET BY MOUTH AT BEDTIME 30 tablet 5   No current facility-administered medications for this visit.     Review of Systems Review of Systems  Constitutional: Negative.   Respiratory: Negative.    Skin: Negative.     Blood pressure 124/85, pulse 91, temperature (!) 97.3 F (36.3 C), temperature source Temporal, resp. rate 16, height 5\' 3"  (1.6 m), weight 169 lb 6.4 oz (76.8 kg), SpO2 98 %.  Physical Exam Physical Exam  Constitutional: She is oriented to person, place, and time. She appears well-developed and well-nourished.  Eyes: Conjunctivae are normal.  Neck: Normal range of motion.  Cardiovascular: Normal rate, regular rhythm and normal heart sounds.  Pulmonary/Chest: Effort normal and breath sounds normal. Right breast exhibits no inverted nipple, no mass, no nipple discharge, no skin change and no tenderness. Left breast exhibits no inverted nipple, no mass, no nipple discharge, no skin change and no tenderness.    Neurological: She is alert and oriented to person, place, and time.  Skin: Skin is warm and dry.    Data Reviewed January 2019 mammogram unremarkable. October 23, 2017 office exam with myself, no note of prominent mass in the right axilla.   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    Plan    The patient has noted increasing discomfort in the area with the trauma from wearing her bra.  With the marked change from her January 2019 exam excision is reasonable.  Risks associated with surgery including pain and healing were discussed..       HPI, Physical Exam, Assessment and Plan have been scribed under the direction and in the presence of Earline MayotteJeffrey W. Tanith Dagostino, MD. Arn MedalSheila Childers, CMA  I have completed the exam and reviewed the above documentation for accuracy and completeness.  I agree with the above.  [employer] has been used and any errors in dictation or transcription are unintentional.  Donnalee CurryJeffrey Zacharius Funari, M.D., F.A.C.S.  Merrily PewJeffrey W Zniyah Midkiff 09/05/2018, 5:21 AM

## 2018-09-05 DIAGNOSIS — R2231 Localized swelling, mass and lump, right upper limb: Secondary | ICD-10-CM

## 2018-09-05 HISTORY — DX: Localized swelling, mass and lump, right upper limb: R22.31

## 2018-09-06 ENCOUNTER — Telehealth: Payer: Self-pay

## 2018-09-07 ENCOUNTER — Inpatient Hospital Stay: Admission: RE | Admit: 2018-09-07 | Payer: BC Managed Care – PPO | Source: Ambulatory Visit

## 2018-09-09 ENCOUNTER — Encounter: Payer: Self-pay | Admitting: General Surgery

## 2018-09-10 ENCOUNTER — Encounter
Admission: RE | Disposition: A | Discharge status: Home / Self Care | Payer: Self-pay | Source: Home / Self Care | Attending: General Surgery

## 2018-09-10 ENCOUNTER — Other Ambulatory Visit: Payer: Self-pay

## 2018-09-10 ENCOUNTER — Ambulatory Visit
Admission: RE | Admit: 2018-09-10 | Discharge: 2018-09-10 | Disposition: A | Discharge status: Home / Self Care | Payer: BC Managed Care – PPO | Attending: General Surgery | Admitting: General Surgery

## 2018-09-10 ENCOUNTER — Ambulatory Visit: Payer: BC Managed Care – PPO | Admitting: Anesthesiology

## 2018-09-10 DIAGNOSIS — E65 Localized adiposity: Secondary | ICD-10-CM | POA: Diagnosis not present

## 2018-09-10 DIAGNOSIS — E119 Type 2 diabetes mellitus without complications: Secondary | ICD-10-CM | POA: Diagnosis not present

## 2018-09-10 DIAGNOSIS — E785 Hyperlipidemia, unspecified: Secondary | ICD-10-CM | POA: Diagnosis not present

## 2018-09-10 DIAGNOSIS — R222 Localized swelling, mass and lump, trunk: Secondary | ICD-10-CM | POA: Diagnosis not present

## 2018-09-10 DIAGNOSIS — J45909 Unspecified asthma, uncomplicated: Secondary | ICD-10-CM | POA: Diagnosis not present

## 2018-09-10 DIAGNOSIS — Z7951 Long term (current) use of inhaled steroids: Secondary | ICD-10-CM | POA: Diagnosis not present

## 2018-09-10 DIAGNOSIS — I10 Essential (primary) hypertension: Secondary | ICD-10-CM | POA: Insufficient documentation

## 2018-09-10 DIAGNOSIS — E559 Vitamin D deficiency, unspecified: Secondary | ICD-10-CM | POA: Diagnosis not present

## 2018-09-10 DIAGNOSIS — Z7984 Long term (current) use of oral hypoglycemic drugs: Secondary | ICD-10-CM | POA: Insufficient documentation

## 2018-09-10 DIAGNOSIS — R2231 Localized swelling, mass and lump, right upper limb: Secondary | ICD-10-CM

## 2018-09-10 DIAGNOSIS — N6001 Solitary cyst of right breast: Secondary | ICD-10-CM | POA: Insufficient documentation

## 2018-09-10 DIAGNOSIS — G4700 Insomnia, unspecified: Secondary | ICD-10-CM | POA: Diagnosis not present

## 2018-09-10 HISTORY — PX: MASS EXCISION: SHX2000

## 2018-09-10 LAB — GLUCOSE, CAPILLARY
Glucose-Capillary: 161 mg/dL — ABNORMAL HIGH (ref 70–99)
Glucose-Capillary: 116 mg/dL — ABNORMAL HIGH (ref 70–99)

## 2018-09-10 SURGERY — EXCISION MASS
Anesthesia: General | Laterality: Right

## 2018-09-10 MED ORDER — LACTATED RINGERS IV SOLN
INTRAVENOUS | Status: DC | PRN
  Administered 2018-09-10: 14:00:00 via INTRAVENOUS

## 2018-09-10 MED ORDER — ACETAMINOPHEN 10 MG/ML IV SOLN
INTRAVENOUS | Status: AC
  Filled 2018-09-10: qty 100

## 2018-09-10 MED ORDER — PROPOFOL 10 MG/ML IV BOLUS
INTRAVENOUS | Status: DC | PRN
  Administered 2018-09-10: 150 mg via INTRAVENOUS

## 2018-09-10 MED ORDER — DEXAMETHASONE SODIUM PHOSPHATE 10 MG/ML IJ SOLN
INTRAMUSCULAR | Status: DC | PRN
  Administered 2018-09-10: 5 mg via INTRAVENOUS

## 2018-09-10 MED ORDER — FAMOTIDINE 20 MG PO TABS
ORAL_TABLET | ORAL | Status: AC
  Administered 2018-09-10: 20 mg via ORAL
  Filled 2018-09-10: qty 1

## 2018-09-10 MED ORDER — GABAPENTIN 300 MG PO CAPS
300.0000 mg | ORAL_CAPSULE | ORAL | Status: AC
  Administered 2018-09-10: 300 mg via ORAL

## 2018-09-10 MED ORDER — HYDROCODONE-ACETAMINOPHEN 5-325 MG PO TABS: 1.0000 | ORAL_TABLET | ORAL | 0 refills | Status: DC | PRN

## 2018-09-10 MED ORDER — MIDAZOLAM HCL 2 MG/2ML IJ SOLN
INTRAMUSCULAR | Status: DC | PRN
  Administered 2018-09-10: 2 mg via INTRAVENOUS

## 2018-09-10 MED ORDER — FENTANYL CITRATE (PF) 100 MCG/2ML IJ SOLN
25.0000 ug | INTRAMUSCULAR | Status: DC | PRN
  Administered 2018-09-10: 25 ug via INTRAVENOUS

## 2018-09-10 MED ORDER — ONDANSETRON HCL 4 MG/2ML IJ SOLN
INTRAMUSCULAR | Status: DC | PRN
  Administered 2018-09-10: 4 mg via INTRAVENOUS

## 2018-09-10 MED ORDER — OXYCODONE HCL 5 MG/5ML PO SOLN: 5.0000 mg | Freq: Once | ORAL | Status: AC | PRN

## 2018-09-10 MED ORDER — BUPIVACAINE-EPINEPHRINE (PF) 0.25% -1:200000 IJ SOLN
INTRAMUSCULAR | Status: DC | PRN
  Administered 2018-09-10: 30 mL

## 2018-09-10 MED ORDER — LIDOCAINE HCL (CARDIAC) PF 100 MG/5ML IV SOSY
PREFILLED_SYRINGE | INTRAVENOUS | Status: DC | PRN
  Administered 2018-09-10: 100 mg via INTRAVENOUS

## 2018-09-10 MED ORDER — CELECOXIB 200 MG PO CAPS
ORAL_CAPSULE | ORAL | Status: AC
  Administered 2018-09-10: 200 mg via ORAL
  Filled 2018-09-10: qty 1

## 2018-09-10 MED ORDER — SODIUM CHLORIDE 0.9 % IV SOLN: INTRAVENOUS | Status: DC

## 2018-09-10 MED ORDER — LIDOCAINE HCL (PF) 2 % IJ SOLN
INTRAMUSCULAR | Status: AC
  Filled 2018-09-10: qty 10

## 2018-09-10 MED ORDER — MIDAZOLAM HCL 2 MG/2ML IJ SOLN
INTRAMUSCULAR | Status: AC
  Filled 2018-09-10: qty 2

## 2018-09-10 MED ORDER — FENTANYL CITRATE (PF) 100 MCG/2ML IJ SOLN
INTRAMUSCULAR | Status: AC
  Administered 2018-09-10: 25 ug via INTRAVENOUS
  Filled 2018-09-10: qty 2

## 2018-09-10 MED ORDER — ONDANSETRON HCL 4 MG/2ML IJ SOLN
INTRAMUSCULAR | Status: AC
  Filled 2018-09-10: qty 2

## 2018-09-10 MED ORDER — PHENYLEPHRINE HCL 10 MG/ML IJ SOLN
INTRAMUSCULAR | Status: DC | PRN
  Administered 2018-09-10 (×2): 100 ug via INTRAVENOUS

## 2018-09-10 MED ORDER — GABAPENTIN 300 MG PO CAPS
ORAL_CAPSULE | ORAL | Status: AC
  Administered 2018-09-10: 300 mg via ORAL
  Filled 2018-09-10: qty 1

## 2018-09-10 MED ORDER — FENTANYL CITRATE (PF) 100 MCG/2ML IJ SOLN
INTRAMUSCULAR | Status: AC
  Filled 2018-09-10: qty 2

## 2018-09-10 MED ORDER — FENTANYL CITRATE (PF) 100 MCG/2ML IJ SOLN
INTRAMUSCULAR | Status: DC | PRN
  Administered 2018-09-10 (×3): 25 ug via INTRAVENOUS

## 2018-09-10 MED ORDER — OXYCODONE HCL 5 MG PO TABS
5.0000 mg | ORAL_TABLET | Freq: Once | ORAL | Status: AC | PRN
  Administered 2018-09-10: 5 mg via ORAL

## 2018-09-10 MED ORDER — DEXAMETHASONE SODIUM PHOSPHATE 10 MG/ML IJ SOLN
INTRAMUSCULAR | Status: AC
  Filled 2018-09-10: qty 1

## 2018-09-10 MED ORDER — ACETAMINOPHEN 10 MG/ML IV SOLN
INTRAVENOUS | Status: DC | PRN
  Administered 2018-09-10: 1000 mg via INTRAVENOUS

## 2018-09-10 MED ORDER — FAMOTIDINE 20 MG PO TABS
20.0000 mg | ORAL_TABLET | Freq: Once | ORAL | Status: AC
  Administered 2018-09-10: 20 mg via ORAL

## 2018-09-10 MED ORDER — PROPOFOL 10 MG/ML IV BOLUS
INTRAVENOUS | Status: AC
  Filled 2018-09-10: qty 20

## 2018-09-10 MED ORDER — OXYCODONE HCL 5 MG PO TABS
ORAL_TABLET | ORAL | Status: AC
  Filled 2018-09-10: qty 1

## 2018-09-10 MED ORDER — CELECOXIB 200 MG PO CAPS
200.0000 mg | ORAL_CAPSULE | ORAL | Status: AC
  Administered 2018-09-10: 200 mg via ORAL

## 2018-09-10 SURGICAL SUPPLY — 38 items
CANISTER SUCT 1200ML W/VALVE (MISCELLANEOUS) ×3 IMPLANT
CHLORAPREP W/TINT 26ML (MISCELLANEOUS) ×3 IMPLANT
CLOSURE WOUND 1/2 X4 (GAUZE/BANDAGES/DRESSINGS) ×1
COVER WAND RF STERILE (DRAPES) IMPLANT
DRAPE LAPAROTOMY 100X77 ABD (DRAPES) ×3 IMPLANT
DRAPE SHEET LG 3/4 BI-LAMINATE (DRAPES) IMPLANT
DRSG TEGADERM 4X4.75 (GAUZE/BANDAGES/DRESSINGS) ×3 IMPLANT
DRSG TELFA 4X3 1S NADH ST (GAUZE/BANDAGES/DRESSINGS) ×3 IMPLANT
ELECT REM PT RETURN 9FT ADLT (ELECTROSURGICAL) ×3
ELECTRODE REM PT RTRN 9FT ADLT (ELECTROSURGICAL) ×1 IMPLANT
GAUZE SPONGE 4X4 12PLY STRL (GAUZE/BANDAGES/DRESSINGS) IMPLANT
GLOVE BIO SURGEON STRL SZ7.5 (GLOVE) ×6 IMPLANT
GLOVE INDICATOR 8.0 STRL GRN (GLOVE) ×6 IMPLANT
GOWN STRL REUS W/ TWL LRG LVL3 (GOWN DISPOSABLE) ×1 IMPLANT
GOWN STRL REUS W/ TWL XL LVL3 (GOWN DISPOSABLE) ×1 IMPLANT
GOWN STRL REUS W/TWL LRG LVL3 (GOWN DISPOSABLE) ×2
GOWN STRL REUS W/TWL XL LVL3 (GOWN DISPOSABLE) ×2
KIT TURNOVER KIT A (KITS) ×3 IMPLANT
LABEL OR SOLS (LABEL) ×3 IMPLANT
NS IRRIG 500ML POUR BTL (IV SOLUTION) ×3 IMPLANT
PACK BASIN MINOR ARMC (MISCELLANEOUS) ×3 IMPLANT
PAD PREP 24X41 OB/GYN DISP (PERSONAL CARE ITEMS) IMPLANT
STOCKINETTE STRL 6IN 960660 (GAUZE/BANDAGES/DRESSINGS) IMPLANT
STRIP CLOSURE SKIN 1/2X4 (GAUZE/BANDAGES/DRESSINGS) ×2 IMPLANT
SUT ETHILON 3-0 (SUTURE) IMPLANT
SUT ETHILON 3-0 FS-10 30 BLK (SUTURE) ×3
SUT ETHILON 4-0 (SUTURE)
SUT ETHILON 4-0 FS2 18XMFL BLK (SUTURE)
SUT VIC AB 2-0 CT1 (SUTURE) IMPLANT
SUT VIC AB 2-0 CT1 27 (SUTURE) ×2
SUT VIC AB 2-0 CT1 TAPERPNT 27 (SUTURE) ×1 IMPLANT
SUT VIC AB 3-0 SH 27 (SUTURE) ×2
SUT VIC AB 3-0 SH 27X BRD (SUTURE) ×1 IMPLANT
SUT VIC AB 4-0 FS2 27 (SUTURE) ×3 IMPLANT
SUT VICRYL+ 3-0 144IN (SUTURE) ×3 IMPLANT
SUTURE EHLN 3-0 FS-10 30 BLK (SUTURE) ×1 IMPLANT
SUTURE ETHLN 4-0 FS2 18XMF BLK (SUTURE) IMPLANT
SWABSTK COMLB BENZOIN TINCTURE (MISCELLANEOUS) ×3 IMPLANT

## 2018-09-10 NOTE — OR Nursing (Signed)
O2 sats dropped shortly after given 25 mcg of fentanyl. Hovers around 88 to 91%. Will go up when reminded to deep breath but then falls back asleep and drops again.

## 2018-09-10 NOTE — Anesthesia Procedure Notes (Signed)
Procedure Name: LMA Insertion Date/Time: 09/10/2018 2:04 PM Performed by: Oletta LamasBlackwell, Aureliano Oshields Delores, CRNA Pre-anesthesia Checklist: Patient identified, Emergency Drugs available, Suction available, Patient being monitored and Timeout performed Patient Re-evaluated:Patient Re-evaluated prior to induction Oxygen Delivery Method: Circle system utilized Preoxygenation: Pre-oxygenation with 100% oxygen Induction Type: IV induction Ventilation: Mask ventilation without difficulty LMA: LMA inserted LMA Size: 4.0 Number of attempts: 1 Placement Confirmation: positive ETCO2 and breath sounds checked- equal and bilateral Tube secured with: Tape Dental Injury: Teeth and Oropharynx as per pre-operative assessment

## 2018-09-10 NOTE — OR Nursing (Signed)
Discussed dressing and shower via tele with Dr. Lemar LivingsByrnett.  Advises pt may shower at any time; and may remove dressing in three days if she wants to.  Patient notified of same.

## 2018-09-10 NOTE — H&P (Signed)
No change in clinical history or exam.  For excision of right axillary fat/ectopic breast tissue.

## 2018-09-10 NOTE — Discharge Instructions (Signed)
AMBULATORY SURGERY  DISCHARGE INSTRUCTIONS   1) The drugs that you were given will stay in your system until tomorrow so for the next 24 hours you should not:  A) Drive an automobile B) Make any legal decisions C) Drink any alcoholic beverage   2) You may resume regular meals tomorrow.  Today it is better to start with liquids and gradually work up to solid foods.  You may eat anything you prefer, but it is better to start with liquids, then soup and crackers, and gradually work up to solid foods.   3) Please notify your doctor immediately if you have any unusual bleeding, trouble breathing, redness and pain at the surgery site, drainage, fever, or pain not relieved by medication.    4) Additional Instructions:              Patient can shower. She can remove bandages after 3 days if she wants.        Please contact your physician with any problems or Same Day Surgery at [phone], Monday through Friday 6 am to 4 pm, or [place] at [facility] Main number at [phone].

## 2018-09-10 NOTE — Op Note (Signed)
Preoperative diagnosis: Right axillary mass.  Postoperative diagnosis: Same.  Operative procedure: Excision right axillary mass.  Operating Surgeon: Donnalee CurryJeffrey Dock Baccam, MD.  Anesthesia: General by LMA, Marcaine 0.25% with 1-200,000's of epinephrine.  Estimated blood loss: Less than 10 cc.  Clinical note: This 57 year old woman is developed a prominent soft tissue mass in the right axilla.  The texture is suggestive of a lipoma but the possibility of ectopic breast tissue is present based on clinical history.  Area was marked to encompass the majority of the mass prior to presenting to the operating theater.  She was felt to be a candidate for excision.  Operative note: With the patient under adequate general anesthesia and a roll behind the right shoulder the area was cleansed with ChloraPrep and draped.   Anesthesia was infiltrated for postoperative comfort.  To an elliptical incision the soft tissue mass was excised.  The ellipse was approximately 3 cm in width and 8 cm in length.  This was extended down into the subcutaneous tissue with dissection completed with electrocautery.  The dissection went down to but not through the axillary envelope.  No other pathology was identified.  Good hemostasis was noted.  The soft tissue was approximated in 2 layers with interrupted 2-0 Vicryl figure-of-eight sutures.  The skin was closed with a running 4-0 Vicryl subcuticular suture.  Benzoin and Steri-Strips followed by Telfa and Tegaderm dressing was applied.  Patient tolerated the procedure well and was taken recovery room in stable condition.

## 2018-09-10 NOTE — Anesthesia Preprocedure Evaluation (Signed)
Anesthesia Evaluation  Patient identified by MRN, date of birth, ID band Patient awake    Reviewed: Allergy & Precautions, H&P , NPO status , Patient's Chart, lab work & pertinent test results  History of Anesthesia Complications Negative for: history of anesthetic complications  Airway Mallampati: III  TM Distance: <3 FB Neck ROM: full    Dental  (+) Chipped   Pulmonary neg shortness of breath, asthma ,           Cardiovascular hypertension, (-) angina(-) Past MI and (-) DOE      Neuro/Psych PSYCHIATRIC DISORDERS negative neurological ROS     GI/Hepatic negative GI ROS, Neg liver ROS,   Endo/Other  diabetes, Type 2  Renal/GU      Musculoskeletal   Abdominal   Peds  Hematology negative hematology ROS (+)   Anesthesia Other Findings Past Medical History: No date: Allergy No date: Anxiety No date: Asthma     Comment:  No flares in 20 years No date: Diabetes mellitus without complication (HCC) No date: Hypercholesterolemia No date: Hypertension No date: Insomnia 11/19/2007: Osteopenia     Comment:  per dexa No date: PMS (premenstrual syndrome) No date: Psoriasis 01/2013: Vitamin D deficiency     Comment:  per PCP;he wants us to manage  Past Surgical History: 11/20/2017: BREAST BIOPSY; Right     Comment:  right breast stereo with calcs path pending No date: CHOLECYSTECTOMY 12/01/2009: COLONOSCOPY     Comment:  Normal, Lutricia FeilPaul Oh, MD MI: 5-year follow-up exam               recommended based on family history. 2010: COLPOSCOPY 2001: TUBAL LIGATION 2014: UPPER GASTROINTESTINAL ENDOSCOPY  BMI    Body Mass Index:  29.99 kg/m      Reproductive/Obstetrics negative OB ROS                             Anesthesia Physical Anesthesia Plan  ASA: III  Anesthesia Plan: General LMA   Post-op Pain Management:    Induction: Intravenous  PONV Risk Score and Plan: Dexamethasone,  Ondansetron, Midazolam and Treatment may vary due to age or medical condition  Airway Management Planned: LMA  Additional Equipment:   Intra-op Plan:   Post-operative Plan: Extubation in OR  Informed Consent: I have reviewed the patients History and Physical, chart, labs and discussed the procedure including the risks, benefits and alternatives for the proposed anesthesia with the patient or authorized representative who has indicated his/her understanding and acceptance.   Dental Advisory Given  Plan Discussed with: Anesthesiologist, CRNA and Surgeon  Anesthesia Plan Comments: (Patient consented for risks of anesthesia including but not limited to:  - adverse reactions to medications - damage to teeth, lips or other oral mucosa - sore throat or hoarseness - Damage to heart, brain, lungs or loss of life  Patient voiced understanding.)        Anesthesia Quick Evaluation

## 2018-09-10 NOTE — Anesthesia Post-op Follow-up Note (Signed)
Anesthesia QCDR form completed.        

## 2018-09-10 NOTE — Transfer of Care (Signed)
Immediate Anesthesia Transfer of Care Note  Patient: Michelle Mckenzie  Procedure(s) Performed: EXCISION AXILLARY MASS (Right )  Patient Location: PACU  Anesthesia Type:General  Level of Consciousness: drowsy  Airway & Oxygen Therapy: Patient Spontanous Breathing and Patient connected to face mask oxygen  Post-op Assessment: Report given to RN and Post -op Vital signs reviewed and stable  Post vital signs: stable  Last Vitals:  Vitals Value Taken Time  BP 118/82 09/10/2018  2:45 PM  Temp 36.2 C 09/10/2018  2:45 PM  Pulse 87 09/10/2018  2:51 PM  Resp 11 09/10/2018  2:51 PM  SpO2 99 % 09/10/2018  2:51 PM  Vitals shown include unvalidated device data.  Last Pain:  Vitals:   09/10/18 1103  TempSrc: Temporal         Complications: No apparent anesthesia complications

## 2018-09-11 ENCOUNTER — Encounter: Payer: Self-pay | Admitting: General Surgery

## 2018-09-12 LAB — SURGICAL PATHOLOGY

## 2018-09-12 NOTE — Anesthesia Postprocedure Evaluation (Signed)
Anesthesia Post Note  Patient: Michelle Mckenzie  Procedure(s) Performed: EXCISION AXILLARY MASS (Right )  Patient location during evaluation: PACU Anesthesia Type: General Level of consciousness: awake and alert Pain management: pain level controlled Vital Signs Assessment: post-procedure vital signs reviewed and stable Respiratory status: spontaneous breathing, nonlabored ventilation, respiratory function stable and patient connected to nasal cannula oxygen Cardiovascular status: blood pressure returned to baseline and stable Postop Assessment: no apparent nausea or vomiting Anesthetic complications: no     Last Vitals:  Vitals:   09/10/18 1551 09/10/18 1653  BP: 137/87 (!) 151/88  Pulse: 89 88  Resp: 16 18  Temp: 36.7 C 36.7 C  SpO2: 96% 94%    Last Pain:  Vitals:   09/10/18 1653  TempSrc: Oral  PainSc: 4                  Cleda MccreedyJoseph K [name]

## 2018-09-13 ENCOUNTER — Telehealth: Payer: Self-pay | Admitting: *Deleted

## 2018-09-13 NOTE — Telephone Encounter (Signed)
Notified patient as instructed, patient pleased. Discussed follow-up appointments, patient agrees Patient will be coming in on 09/20/2018 for RN wound check than seeing Dr. Earlene Plateravis on 10/02/2018

## 2018-09-13 NOTE — Telephone Encounter (Signed)
-----   Message from Earline MayotteJeffrey W Byrnett, MD sent at 09/13/2018 12:04 PM EST ----- Please notify the patient that the pathology showed only fatty tissue, no breast tissue. RN check as scheduled next week.  ----- Message ----- From: Interface, Lab In Three Zero One Sent: 09/12/2018   8:27 PM EST To: Earline MayotteJeffrey W Byrnett, MD

## 2018-09-20 ENCOUNTER — Ambulatory Visit (INDEPENDENT_AMBULATORY_CARE_PROVIDER_SITE_OTHER): Payer: BC Managed Care – PPO | Admitting: *Deleted

## 2018-09-20 DIAGNOSIS — R2231 Localized swelling, mass and lump, right upper limb: Secondary | ICD-10-CM

## 2018-09-20 NOTE — Progress Notes (Signed)
Patient ID: Michelle Mckenzie, female   DOB: 06/13/1961, [age] y.o.   MRN: [id number]   Patient came in today for a wound check post right axillary mass excision .  The patient is aware to use a heating pad as needed for comfort. The wound is clean, with no signs of infection noted. Follow up as scheduled.

## 2018-09-20 NOTE — Patient Instructions (Signed)
The patient is aware to call back for any questions or concerns.  

## 2018-09-25 ENCOUNTER — Encounter: Payer: BC Managed Care – PPO | Admitting: General Surgery

## 2018-09-27 ENCOUNTER — Encounter: Payer: Self-pay | Admitting: Internal Medicine

## 2018-10-02 ENCOUNTER — Encounter: Payer: BC Managed Care – PPO | Admitting: Surgery

## 2018-10-04 ENCOUNTER — Other Ambulatory Visit: Payer: Self-pay

## 2018-10-04 ENCOUNTER — Ambulatory Visit (INDEPENDENT_AMBULATORY_CARE_PROVIDER_SITE_OTHER): Payer: BC Managed Care – PPO | Admitting: Surgery

## 2018-10-04 ENCOUNTER — Encounter: Payer: Self-pay | Admitting: Surgery

## 2018-10-04 VITALS — BP 124/84 | HR 95 | Temp 97.5°F | Resp 12 | Ht 63.0 in | Wt 174.0 lb

## 2018-10-04 DIAGNOSIS — R2231 Localized swelling, mass and lump, right upper limb: Secondary | ICD-10-CM

## 2018-10-04 DIAGNOSIS — Z4889 Encounter for other specified surgical aftercare: Secondary | ICD-10-CM

## 2018-10-04 NOTE — Patient Instructions (Addendum)
The patient is aware to call back for any questions or new concerns. Gradually resume activities as tolerated. May use deodorant.

## 2018-10-04 NOTE — Progress Notes (Signed)
Surgical Clinic Progress/Follow-up Note   HPI:  58 y.o. Female presents to clinic for post-op follow-up 24 Days s/p excision of Right axillary mass (Byrnett, 09/10/2018). Patient reports she's been feeling well without significant post-surgical pain, peri-incisional redness or drainage, fever/chills, CP, or SOB.  Review of Systems:  Constitutional: denies fever/chills  Respiratory: denies shortness of breath, wheezing  Cardiovascular: denies chest pain, palpitations  Gastrointestinal: denies abdominal pain, N/V, or diarrhea Skin: Denies any other rashes or skin discolorations except post-surgical wounds as per interval history  Vital Signs:  BP 124/84   Pulse 95   Temp (!) 97.5 F (36.4 C) (Skin)   Resp 12   Ht 5\' 3"  (1.6 m)   Wt 174 lb (78.9 kg)   SpO2 96%   BMI 30.82 kg/m    Physical Exam:  Constitutional:  -- Normal body habitus  -- Awake, alert, and oriented x3  Pulmonary:  -- No crackles -- Equal breath sounds bilaterally -- Breathing non-labored at rest Cardiovascular:  -- S1, S2 present  -- No pericardial rubs  Musculoskeletal / Integumentary:  -- Wounds or skin discoloration: Right axillary post-surgical incisional wound well-approximated, soft, and completely non-tender to palpation without surrounding erythema or drainage -- Extremities: B/L UE and LE FROM, hands and feet warm, no edema   Imaging: No new pertinent imaging available for review  Surgical Pathology (09/10/2018): A. SOFT TISSUE MASS, RIGHT AXILLA; EXCISION:  - BENIGN FATTY BREAST TISSUE WITH A FEW CYSTS.  - NEGATIVE FOR EPITHELIAL PROLIFERATIVE CHANGES AND ATYPIA.  Assessment:  58 y.o. yo Female with a problem list including...  Patient Active Problem List   Diagnosis Date Noted  . Axillary mass, right 09/05/2018  . Vaccine counseling 02/02/2018  . Breast microcalcifications 10/23/2017  . Family history of colon cancer 10/23/2017  . GAD (generalized anxiety disorder) 10/03/2017  .  Hyperlipidemia associated with type 2 diabetes mellitus (HCC) 10/03/2017  . Insomnia 08/01/2017  . Anxiety 03/16/2017  . Travel advice encounter 03/16/2017  . Onychomycosis 10/24/2016  . Allergic rhinitis 06/29/2016  . Hypertension 06/29/2016  . Rash and nonspecific skin eruption 11/30/2015  . Diarrhea 11/30/2015  . Sinusitis, acute 08/13/2015  . Shoulder pain, left 04/22/2015  . History of iron deficiency 11/17/2014  . Vitamin D deficiency 05/22/2014  . Obesity, unspecified 05/01/2014  . Diabetes type 2, controlled (HCC) 10/30/2013    presents to clinic for post-op follow-up evaluation, doing well 24 Days s/p excision of Right axillary mass (Byrnett, 09/10/2018).  Plan:              - okay to submerge incisions under water (baths, swimming) prn             - gradually resume all activities without restrictions over next 2 weeks             - apply sunblock particularly to incisions with sun exposure to reduce pigmentation of scars             - return to clinic as needed, instructed to call office if any questions or concerns  All of the above recommendations were discussed with the patient, and all of patient's questions were answered to her expressed satisfaction.  -- Scherrie Gerlach Earlene Plater, MD, RPVI [place]: [facility] General Surgery - Partnering for exceptional care. Office: [phone]

## 2018-11-28 ENCOUNTER — Ambulatory Visit
Admission: RE | Admit: 2018-11-28 | Discharge: 2018-11-28 | Disposition: A | Discharge status: Home / Self Care | Payer: BC Managed Care – PPO | Source: Ambulatory Visit | Attending: Obstetrics and Gynecology | Admitting: Obstetrics and Gynecology

## 2018-11-28 DIAGNOSIS — Z1231 Encounter for screening mammogram for malignant neoplasm of breast: Secondary | ICD-10-CM | POA: Diagnosis present

## 2018-11-28 DIAGNOSIS — Z1239 Encounter for other screening for malignant neoplasm of breast: Secondary | ICD-10-CM

## 2018-12-01 ENCOUNTER — Encounter: Payer: Self-pay | Admitting: Obstetrics and Gynecology

## 2019-04-02 ENCOUNTER — Other Ambulatory Visit: Payer: Self-pay | Admitting: Obstetrics and Gynecology

## 2019-04-02 DIAGNOSIS — G4700 Insomnia, unspecified: Secondary | ICD-10-CM

## 2019-04-03 NOTE — Telephone Encounter (Signed)
Please advise 

## 2019-11-21 ENCOUNTER — Encounter: Payer: Self-pay | Admitting: Obstetrics and Gynecology

## 2019-11-25 ENCOUNTER — Ambulatory Visit: Payer: BC Managed Care – PPO | Admitting: Obstetrics and Gynecology

## 2019-12-01 ENCOUNTER — Ambulatory Visit: Payer: BC Managed Care – PPO

## 2019-12-18 ENCOUNTER — Ambulatory Visit: Payer: BC Managed Care – PPO | Admitting: Obstetrics and Gynecology

## 2019-12-18 ENCOUNTER — Ambulatory Visit: Payer: BC Managed Care – PPO | Attending: Internal Medicine

## 2019-12-18 DIAGNOSIS — Z23 Encounter for immunization: Secondary | ICD-10-CM

## 2019-12-18 NOTE — Progress Notes (Signed)
   Covid-19 Vaccination Clinic  Name:  Michelle Mckenzie    MRN: [id number] DOB: 03-29-1961  12/18/2019  [name] was observed post Covid-19 immunization for 15 minutes without incident. She was provided with Vaccine Information Sheet and instruction to access the V-Safe system.   [name] was instructed to call 911 with any severe reactions post vaccine: Marland Kitchen Difficulty breathing  . Swelling of face and throat  . A fast heartbeat  . A bad rash all over body  . Dizziness and weakness   Immunizations Administered    Name Date Dose VIS Date Route   Pfizer COVID-19 Vaccine 12/18/2019  3:01 PM 0.3 mL 09/06/2019 Intramuscular   Manufacturer: [employer], [employer]   Lot: [id number]   NDC: [address]-1

## 2019-12-19 ENCOUNTER — Encounter: Payer: Self-pay | Admitting: Obstetrics and Gynecology

## 2019-12-19 ENCOUNTER — Ambulatory Visit (INDEPENDENT_AMBULATORY_CARE_PROVIDER_SITE_OTHER): Payer: BC Managed Care – PPO | Admitting: Obstetrics and Gynecology

## 2019-12-19 ENCOUNTER — Other Ambulatory Visit: Payer: Self-pay

## 2019-12-19 VITALS — BP 128/80 | Ht 63.0 in | Wt 165.0 lb

## 2019-12-19 DIAGNOSIS — Z1211 Encounter for screening for malignant neoplasm of colon: Secondary | ICD-10-CM | POA: Diagnosis not present

## 2019-12-19 DIAGNOSIS — Z800 Family history of malignant neoplasm of digestive organs: Secondary | ICD-10-CM

## 2019-12-19 DIAGNOSIS — Z01419 Encounter for gynecological examination (general) (routine) without abnormal findings: Secondary | ICD-10-CM | POA: Diagnosis not present

## 2019-12-19 DIAGNOSIS — Z1231 Encounter for screening mammogram for malignant neoplasm of breast: Secondary | ICD-10-CM

## 2019-12-19 NOTE — Patient Instructions (Signed)
I value your feedback and entrusting us with your care. If you get a [facility] patient survey, I would appreciate you taking the time to let us know about your experience today. Thank you! ° °As of September 05, 2019, your lab results will be released to your MyChart immediately, before I even have a chance to see them. Please give me time to review them and contact you if there are any abnormalities. Thank you for your patience.  ° °[facility]: [phone] ° ° ° °

## 2019-12-19 NOTE — Progress Notes (Signed)
PCP: Dion Body, MD   Chief Complaint  Patient presents with  . Gynecologic Exam    HPI:      [name] is a 59 y.o. No obstetric history on file. who LMP was No LMP recorded. Patient is postmenopausal., presents today for her annual examination.  Her menses are absent due to menopause. She does not have intermenstrual bleeding. She does not have vasomotor sx.   Sex activity: single partner, contraception - post menopausal status. She does not have vaginal dryness.  Last Pap: 08/27/18 Results were: no abnormalities /neg HPV DNA.  Hx of STDs: none  Last mammogram: 11/28/18 Results were: normal; repeat in 12 months.  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  Pt with RT axillary mass for many yrs; larger and more tender last yr. Had exc with Dr. Bary Castilla and shown to be benign fatty tissue.  There is no FH of breast cancer. There is no FH of ovarian cancer. The patient does do self-breast exams.  Colonoscopy: colonoscopy 2011 without abnormalities with Dr. Candace Cruise.  Repeat due after 10 years. There is FH of colon cancer in her brother, who is "gene" neg DEXA: 2009--osteopenia in spine  Tobacco use: The patient denies current or previous tobacco use. Alcohol use: none  No drug use Exercise: moderately active  She does get adequate calcium and Vitamin D in her diet.  She has labs with PCP. Pt has a hx of type 2 DM and hypothyroidism. She is followed by endocrine. Was on zolpidem for many yrs, now using trazodone, Rx'd by PCP  Past Medical History:  Diagnosis Date  . Allergy   . Anxiety   . Asthma    No flares in 20 years  . Axillary mass, right 09/05/2018  . Diabetes mellitus without complication ([place])   . Hypercholesterolemia   . Hypertension   . Insomnia   . Osteopenia 11/19/2007   per dexa  . PMS (premenstrual syndrome)   . Psoriasis   . Vitamin D deficiency 01/2013   per PCP;he wants [place] to manage    Past Surgical History:    Procedure Laterality Date  . BREAST BIOPSY Right 11/20/2017   right breast stereo  fibroadenomatous change  . CHOLECYSTECTOMY    . COLONOSCOPY  12/01/2009   Normal, Verdie Shire, MD MI: 5-year follow-up exam recommended based on family history.  . COLPOSCOPY  2010  . MASS EXCISION Right 09/10/2018   Procedure: EXCISION AXILLARY MASS;  Surgeon: Robert Bellow, MD;  Location: [facility] ORS;  Service: General;  Laterality: Right;  . TUBAL LIGATION  2001  . UPPER GASTROINTESTINAL ENDOSCOPY  2014    Family History  Problem Relation Age of Onset  . Cancer Father        prostate cancer  . Colon cancer Brother 36       gene neg  . Arthritis Maternal Grandmother   . Stroke Maternal Grandmother   . Arthritis Maternal Grandfather        RA  . Arthritis Paternal Grandmother   . Heart disease Paternal Grandmother 75       CAD - MI - Died  . Arthritis Paternal Grandfather   . Breast cancer Neg Hx     Social History   Socioeconomic History  . Marital status: Married    Spouse name: Anique Beckley  . Number of children: 4  . Years of education: 19  . Highest education level: Not on file  Occupational History  .  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 Smoking status: Never Smoker  . Smokeless tobacco: Never Used  Substance and Sexual Activity  . Alcohol use: Yes    Alcohol/week: 0.0 standard drinks    Comment: Occasionally  . Drug use: No  . Sexual activity: Yes    Birth control/protection: Post-menopausal  Other Topics Concern  . Not on file  Social History Narrative   Michelle Mckenzie grew up in [place]. She attended [employer] and obtained her Bachelor's in [employer]. 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 She lives at home with her husband. They have 4 children Belenda Cruise [address], Will 20, [name], [place] 13). She spend time going to soccer games. She enjoys reading but her favorite  thing to do is going to the beach.    Social Determinants of Health   Financial Resource Strain:   . Difficulty of Paying Living Expenses:   Food Insecurity:   . Worried About Charity fundraiser in the Last Year:   . Arboriculturist in the Last Year:   Transportation Needs:   . Film/video editor (Medical):   Marland Kitchen Lack of Transportation (Non-Medical):   Physical Activity:   . Days of Exercise per Week:   . Minutes of Exercise per Session:   Stress:   . Feeling of Stress :   Social Connections:   . Frequency of Communication with Friends and Family:   . Frequency of Social Gatherings with Friends and Family:   . Attends Religious Services:   . Active Member of Clubs or Organizations:   . Attends Archivist Meetings:   Marland Kitchen Marital Status:   Intimate Partner Violence:   . Fear of Current or Ex-Partner:   . Emotionally Abused:   Marland Kitchen Physically Abused:   . Sexually Abused:     Current Meds  Medication Sig  . Blood Glucose Monitoring Suppl (FIFTY50 GLUCOSE METER 2.0) w/Device KIT Use to test blood sugar.  Please use whatever meter/strips are on formulary.  . cetirizine (ZYRTEC) 10 MG tablet Take 10 mg by mouth daily.  . citalopram (CELEXA) 10 MG tablet TAKE 1 TABLET BY MOUTH EVERY DAY (Patient taking differently: Take 10 mg by mouth daily. )  . colestipol (COLESTID) 1 g tablet TAKE 1 TABLET BY MOUTH TWICE A DAY (Patient taking differently: Take 1 g by mouth daily. )  . Continuous Blood Gluc Sensor (FREESTYLE LIBRE 14 DAY SENSOR) MISC Use 1 each every 14 (fourteen) days E11.65  . fluticasone (FLONASE) 50 MCG/ACT nasal spray PLACE 2 SPRAYS INTO BOTH NOSTRILS DAILY. (Patient taking differently: Place 2 sprays into both nostrils daily as needed for allergies. )  . glucose blood (AGAMATRIX PRESTO TEST) test strip Use 1 each (1 strip total) once daily as needed  . Insulin Pen Needle (BD PEN NEEDLE NANO U/F) 32G X 4 MM MISC USE ONE NEEDLE ONCE A DAY AS DIRECTED WITH VICTOZA PEN  .  JARDIANCE 25 MG TABS tablet Take 25 mg by mouth daily.  Marland Kitchen losartan (COZAAR) 50 MG tablet Take 1 tablet (50 mg total) by mouth daily.  . Melatonin 10 MG TABS Take 10 mg by mouth at bedtime as needed (sleep).   . metFORMIN (GLUCOPHAGE-XR) 500 MG 24 hr tablet Take 2,000 mg by mouth daily.   . montelukast (SINGULAIR) 10 MG tablet TAKE 1 TABLET (10 MG TOTAL) BY MOUTH AT BEDTIME.  Marland Kitchen  Multiple Vitamin (MULTI-VITAMINS) TABS Take 1 tablet by mouth daily.   Glory Rosebush DELICA LANCETS 82U MISC USE TO TEST BLOOD SUGAR TWICE A DAY AS DIRECTED  . Secukinumab (COSENTYX) 150 MG/ML SOSY Inject 1 Stick into the skin every 30 (thirty) days.   . Semaglutide, 1 MG/DOSE, (OZEMPIC, 1 MG/DOSE,) 2 MG/1.5ML SOPN Inject into the skin.  Marland Kitchen traZODone (DESYREL) 50 MG tablet TAKE 1 TABLET BY MOUTH EVERY DAY AT BEDTIME AS NEEDED FOR SLEEP  . Vitamin D, Ergocalciferol, (DRISDOL) 1.25 MG (50000 UNIT) CAPS capsule Take 50,000 Units by mouth every 14 (fourteen) days.      ROS:  Review of Systems  Constitutional: Negative for fatigue, fever and unexpected weight change.  Respiratory: Negative for cough, shortness of breath and wheezing.   Cardiovascular: Negative for chest pain, palpitations and leg swelling.  Gastrointestinal: Negative for blood in stool, constipation, diarrhea, nausea and vomiting.  Endocrine: Negative for cold intolerance, heat intolerance and polyuria.  Genitourinary: Negative for dyspareunia, dysuria, flank pain, frequency, genital sores, hematuria, menstrual problem, pelvic pain, urgency, vaginal bleeding, vaginal discharge and vaginal pain.  Musculoskeletal: Negative for back pain, joint swelling and myalgias.  Skin: Negative for rash.  Neurological: Negative for dizziness, syncope, light-headedness, numbness and headaches.  Hematological: Negative for adenopathy.  Psychiatric/Behavioral: Negative for agitation, confusion, sleep disturbance and suicidal ideas. The patient is not nervous/anxious.       Objective: BP 128/80   Ht 5' 3"  (1.6 m)   Wt 165 lb (74.8 kg)   BMI 29.23 kg/m    Physical Exam Constitutional:      Appearance: She is well-developed.  Genitourinary:     Vulva, vagina, cervix, uterus, right adnexa and left adnexa normal.     No vulval lesion or tenderness noted.     No vaginal discharge, erythema or tenderness.     No cervical polyp.     Uterus is not enlarged or tender.     No right or left adnexal mass present.     Right adnexa not tender.     Left adnexa not tender.  Neck:     Thyroid: No thyromegaly.  Cardiovascular:     Rate and Rhythm: Normal rate and regular rhythm.     Heart sounds: Normal heart sounds. No murmur.  Pulmonary:     Effort: Pulmonary effort is normal.     Breath sounds: Normal breath sounds.  Chest:     Breasts:        Right: No mass, nipple discharge, skin change or tenderness.        Left: No mass, nipple discharge, skin change or tenderness.  Abdominal:     Palpations: Abdomen is soft.     Tenderness: There is no abdominal tenderness. There is no guarding.  Musculoskeletal:        General: Normal range of motion.     Cervical back: Normal range of motion.  Neurological:     General: No focal deficit present.     Mental Status: She is alert and oriented to person, place, and time.     Cranial Nerves: No cranial nerve deficit.  Skin:    General: Skin is warm and dry.  Psychiatric:        Mood and Affect: Mood normal.        Behavior: Behavior normal.        Thought Content: Thought content normal.        Judgment: Judgment normal.  Vitals reviewed.  Assessment/Plan:  Encounter for annual routine gynecological examination  Encounter for screening mammogram for malignant neoplasm of breast - Plan: MM 3D SCREEN BREAST BILATERAL; pt to sched mammo  Screening for colon cancer - Plan: Ambulatory referral to Gastroenterology; refer to GI for colonoscopy due to [place]  Family history of colon cancer - Plan:  Ambulatory referral to Gastroenterology; brother is "gene neg"        GYN counsel mammography screening, adequate intake of calcium and vitamin D, diet and exercise    F/U  Return in about 1 year (around 12/18/2020).  Caliber Landess B. Anaka Beazer, PA-C 12/19/2019 4:34 PM

## 2020-01-15 ENCOUNTER — Ambulatory Visit: Payer: BC Managed Care – PPO | Attending: Internal Medicine

## 2020-01-15 DIAGNOSIS — Z23 Encounter for immunization: Secondary | ICD-10-CM

## 2020-01-15 NOTE — Progress Notes (Signed)
   Covid-19 Vaccination Clinic  Name:  MAKENZIE WEISNER    MRN: [id number] DOB: [birth year]  01/15/2020  Ms. Beane was observed post Covid-19 immunization for 15 minutes without incident. She was provided with Vaccine Information Sheet and instruction to access the V-Safe system.   Ms. Bobeck was instructed to call 911 with any severe reactions post vaccine: Marland Kitchen Difficulty breathing  . Swelling of face and throat  . A fast heartbeat  . A bad rash all over body  . Dizziness and weakness   Immunizations Administered    Name Date Dose VIS Date Route   Pfizer COVID-19 Vaccine 01/15/2020  2:11 PM 0.3 mL 11/20/2018 Intramuscular   Manufacturer: [employer], [employer]   Lot: [id number]   NDC: [address]-0

## 2020-01-16 ENCOUNTER — Other Ambulatory Visit: Payer: Self-pay | Admitting: Orthopedic Surgery

## 2020-01-16 DIAGNOSIS — M25561 Pain in right knee: Secondary | ICD-10-CM

## 2020-01-20 ENCOUNTER — Other Ambulatory Visit: Payer: Self-pay

## 2020-01-20 ENCOUNTER — Ambulatory Visit
Admission: RE | Admit: 2020-01-20 | Discharge: 2020-01-20 | Disposition: A | Discharge status: Home / Self Care | Payer: BC Managed Care – PPO | Source: Ambulatory Visit | Attending: Orthopedic Surgery | Admitting: Orthopedic Surgery

## 2020-01-20 DIAGNOSIS — M25561 Pain in right knee: Secondary | ICD-10-CM | POA: Insufficient documentation

## 2020-01-21 ENCOUNTER — Other Ambulatory Visit: Payer: Self-pay

## 2020-01-22 ENCOUNTER — Other Ambulatory Visit: Payer: Self-pay

## 2020-01-22 ENCOUNTER — Telehealth (INDEPENDENT_AMBULATORY_CARE_PROVIDER_SITE_OTHER): Payer: Self-pay | Admitting: Gastroenterology

## 2020-01-22 DIAGNOSIS — Z1211 Encounter for screening for malignant neoplasm of colon: Secondary | ICD-10-CM

## 2020-01-22 NOTE — Progress Notes (Signed)
Gastroenterology Pre-Procedure Review  Request Date: Patient to call back Requesting Physician: Dr. ___________  PATIENT REVIEW QUESTIONS: The patient responded to the following health history questions as indicated:    1. Are you having any GI issues? no 2. Do you have a personal history of Polyps? no 3. Do you have a family history of Colon Cancer or Polyps? brother had colon cancer.  patient states not genetic 4. Diabetes Mellitus? yes (oral meds) 5. Joint replacements in the past 12 months?no 6. Major health problems in the past 3 months?no 7. Any artificial heart valves, MVP, or defibrillator?no    MEDICATIONS & ALLERGIES:    Patient reports the following regarding taking any anticoagulation/antiplatelet therapy:   Plavix, Coumadin, Eliquis, Xarelto, Lovenox, Pradaxa, Brilinta, or Effient? no Aspirin? no  Patient confirms/reports the following medications:  Current Outpatient Medications  Medication Sig Dispense Refill  . cetirizine (ZYRTEC) 10 MG tablet Take 10 mg by mouth daily.    . citalopram (CELEXA) 10 MG tablet TAKE 1 TABLET BY MOUTH EVERY DAY (Patient taking differently: Take 10 mg by mouth daily. ) 90 tablet 1  . colestipol (COLESTID) 1 g tablet TAKE 1 TABLET BY MOUTH TWICE A DAY (Patient taking differently: Take 1 g by mouth daily. ) 60 tablet 2  . Continuous Blood Gluc Sensor (FREESTYLE LIBRE 14 DAY SENSOR) MISC Use 1 each every 14 (fourteen) days E11.65    . fluticasone (FLONASE) 50 MCG/ACT nasal spray PLACE 2 SPRAYS INTO BOTH NOSTRILS DAILY. (Patient taking differently: Place 2 sprays into both nostrils daily as needed for allergies. ) 16 g 11  . glucose blood (AGAMATRIX PRESTO TEST) test strip Use 1 each (1 strip total) once daily as needed    . JARDIANCE 25 MG TABS tablet Take 25 mg by mouth daily.    Marland Kitchen losartan (COZAAR) 50 MG tablet Take 1 tablet (50 mg total) by mouth daily. 90 tablet 3  . Melatonin 10 MG TABS Take 10 mg by mouth at bedtime as needed (sleep).      . metFORMIN (GLUCOPHAGE-XR) 500 MG 24 hr tablet Take 2,000 mg by mouth daily.   1  . montelukast (SINGULAIR) 10 MG tablet TAKE 1 TABLET (10 MG TOTAL) BY MOUTH AT BEDTIME. 30 tablet 3  . Multiple Vitamin (MULTI-VITAMINS) TABS Take 1 tablet by mouth daily.     Glory Rosebush DELICA LANCETS 72C MISC USE TO TEST BLOOD SUGAR TWICE A DAY AS DIRECTED    . Secukinumab (COSENTYX) 150 MG/ML SOSY Inject 1 Stick into the skin every 30 (thirty) days.     . Semaglutide, 1 MG/DOSE, (OZEMPIC, 1 MG/DOSE,) 2 MG/1.5ML SOPN Inject into the skin.    Marland Kitchen traZODone (DESYREL) 50 MG tablet TAKE 1 TABLET BY MOUTH EVERY DAY AT BEDTIME AS NEEDED FOR SLEEP    . Vitamin D, Ergocalciferol, (DRISDOL) 1.25 MG (50000 UNIT) CAPS capsule Take 50,000 Units by mouth every 14 (fourteen) days.    . Blood Glucose Monitoring Suppl (FIFTY50 GLUCOSE METER 2.0) w/Device KIT Use to test blood sugar.  Please use whatever meter/strips are on formulary.    . Cysteamine Bitartrate (PROCYSBI) 300 MG PACK Use 1 each 2 (two) times daily Use as instructed.    . Insulin Pen Needle (BD PEN NEEDLE NANO U/F) 32G X 4 MM MISC USE ONE NEEDLE ONCE A DAY AS DIRECTED WITH VICTOZA PEN    . lovastatin (MEVACOR) 40 MG tablet Take 40 mg by mouth at bedtime.      No current facility-administered  medications for this visit.    Patient confirms/reports the following allergies:  Allergies  Allergen Reactions  . Lanolin Rash    No orders of the defined types were placed in this encounter.   AUTHORIZATION INFORMATION Primary Insurance: 1D#: Group #:  Secondary Insurance: 1D#: Group #:  SCHEDULE INFORMATION: Date: Friday PENDING PT CALL BACK Time: Location:[facility]

## 2020-01-22 NOTE — Patient Instructions (Signed)
Colonoscopy Triage completed.  Patient will call office back to schedule due to work and travel plans.  Instructions will be sent via mychart for her to review before hand.  Thanks,  Marcelino Duster, CMA  GI

## 2020-07-09 ENCOUNTER — Ambulatory Visit (INDEPENDENT_AMBULATORY_CARE_PROVIDER_SITE_OTHER): Payer: BC Managed Care – PPO

## 2020-07-09 ENCOUNTER — Encounter: Payer: Self-pay | Admitting: Obstetrics and Gynecology

## 2020-07-09 ENCOUNTER — Other Ambulatory Visit: Payer: Self-pay

## 2020-07-09 DIAGNOSIS — R3915 Urgency of urination: Secondary | ICD-10-CM

## 2020-07-09 DIAGNOSIS — M545 Low back pain, unspecified: Secondary | ICD-10-CM | POA: Diagnosis not present

## 2020-07-09 LAB — POCT URINALYSIS DIPSTICK
Appearance: NORMAL
Bilirubin, UA: NEGATIVE
Blood, UA: NEGATIVE
Glucose, UA: POSITIVE — AB
Ketones, UA: NEGATIVE
Leukocytes, UA: NEGATIVE
Nitrite, UA: NEGATIVE
Odor: NORMAL
Protein, UA: NEGATIVE
Spec Grav, UA: 1.02 (ref 1.010–1.025)
Urobilinogen, UA: 0.2 E.U./dL
pH, UA: 5 (ref 5.0–8.0)

## 2020-07-09 NOTE — Progress Notes (Signed)
Patient presents for urinalysis per Althea Grimmer, PAC instructions. She was awakened last night with the urge to urinate and reports having low back/kidney pain. Urinalysis performed (negative) results recorded. Culture drawn/ordered sent per [facility] request.

## 2020-07-13 LAB — URINE CULTURE

## 2020-07-17 ENCOUNTER — Ambulatory Visit
Admission: RE | Admit: 2020-07-17 | Discharge: 2020-07-17 | Disposition: A | Discharge status: Home / Self Care | Payer: BC Managed Care – PPO | Source: Ambulatory Visit | Attending: Obstetrics and Gynecology | Admitting: Obstetrics and Gynecology

## 2020-07-17 ENCOUNTER — Other Ambulatory Visit: Payer: Self-pay

## 2020-07-17 DIAGNOSIS — Z1231 Encounter for screening mammogram for malignant neoplasm of breast: Secondary | ICD-10-CM | POA: Diagnosis not present

## 2021-05-11 NOTE — Progress Notes (Signed)
PCP: Dion Body, MD   Chief Complaint  Patient presents with   Gynecologic Exam    No concerns    HPI:      [name] is a 60 y.o. No obstetric history on file. who LMP was No LMP recorded. Patient is postmenopausal., presents today for her annual examination.  Her menses are absent due to menopause. She does not have PMB. She does not have vasomotor sx.   Sex activity: single partner, contraception - post menopausal status. She does have vaginal dryness, improved with lubricants.  Last Pap: 08/27/18 Results were: no abnormalities /neg HPV DNA.  Hx of STDs: none  Having issues with ext vaginal itching off and on. Now on jardiance for DM and thinks related. No increased d/c or odor. Treating with monistat cream externally with some improvement. Sometimes uses dryer sheets and pads with exercise, some cotton underwear use.   Last mammogram: 07/17/20 Results were: normal; repeat in 12 months.  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  Pt with RT axillary mass for many yrs; larger and more tender last yr. Had exc with Dr. Bary Castilla and shown to be benign fatty tissue.  There is no FH of breast cancer. There is no FH of ovarian cancer. The patient does do self-breast exams.  Colonoscopy: colonoscopy 2011 without abnormalities with Dr. Candace Mckenzie.  Repeat due after 10 years but pt didn't do last yr. Did GI ref last yr but didn't sched colonoscopy due to sched. Would like new referral. There is FH of colon cancer in her brother, who is "gene" neg DEXA: 2009--osteopenia in spine  Tobacco use: The patient denies current or previous tobacco use. Alcohol use: none  No drug use Exercise: moderately active  She does get adequate calcium and Vitamin D in her diet.  She has labs with PCP. Pt has a hx of type 2 DM and hypothyroidism. She is followed by endocrine. Was on zolpidem for many yrs, now using trazodone, Rx'd by PCP  Past Medical History:  Diagnosis  Date   Allergy    Anxiety    Asthma    No flares in 20 years   Axillary mass, right 09/05/2018   Diabetes mellitus without complication ([place])    Hypercholesterolemia    Hypertension    Insomnia    Osteopenia 11/19/2007   per dexa   PMS (premenstrual syndrome)    Psoriasis    Vitamin D deficiency 01/2013   per PCP;he wants [place] to manage    Past Surgical History:  Procedure Laterality Date   BREAST BIOPSY Right 11/20/2017   right breast stereo  fibroadenomatous change   CHOLECYSTECTOMY     COLONOSCOPY  12/01/2009   Normal, Verdie Shire, MD MI: 5-year follow-up exam recommended based on family history.   COLPOSCOPY  2010   MASS EXCISION Right 09/10/2018   Procedure: EXCISION AXILLARY MASS;  Surgeon: Robert Bellow, MD;  Location: [facility] ORS;  Service: General;  Laterality: Right;   TUBAL LIGATION  2001   UPPER GASTROINTESTINAL ENDOSCOPY  2014    Family History  Problem Relation Age of Onset   Cancer Father        prostate cancer   Colon cancer Brother 24       gene neg   Arthritis Maternal Grandmother    Stroke Maternal Grandmother    Arthritis Maternal Grandfather        RA   Arthritis Paternal Grandmother    Heart disease  Paternal Grandmother 51       CAD - MI - Died   Arthritis Paternal Grandfather    Breast cancer Neg Hx     Social History   Socioeconomic History   Marital status: Married    Spouse name: Michelle Mckenzie   Number of children: 4   Years of education: 16   Highest education level: Not on file  Occupational History   Occupation: Pharmacist, 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 She attended [employer] and obtained her Bachelor's in [employer]. 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 She lives at home with her husband. They have 4 children Michelle Mckenzie [address] Will 20, Michelle Mckenzie 15, Michelle Mckenzie 13). She spend time going to soccer games. She enjoys reading but her favorite thing to do is going to the beach.    Social Determinants of Health   Financial Resource Strain: Not on file  Food Insecurity: Not on file  Transportation Needs: Not on file  Physical Activity: Not on file  Stress: Not on file  Social Connections: Not on file  Intimate Partner Violence: Not on file    Current Meds  Medication Sig   Blood Glucose Monitoring Suppl (FIFTY50 GLUCOSE METER 2.0) w/Device KIT Use to test blood sugar.  Please use whatever meter/strips are on formulary.   cetirizine (ZYRTEC) 10 MG tablet Take 10 mg by mouth daily.   citalopram (CELEXA) 10 MG tablet TAKE 1 TABLET BY MOUTH EVERY DAY (Patient taking differently: Take 10 mg by mouth daily.)   Continuous Blood Gluc Sensor (FREESTYLE LIBRE 14 DAY SENSOR) MISC Use 1 each every 14 (fourteen) days E11.65   fluconazole (DIFLUCAN) 150 MG tablet Take 1 tablet (150 mg total) by mouth once for 1 dose. May repeat in 3 days if still having symptoms   fluticasone (FLONASE) 50 MCG/ACT nasal spray PLACE 2 SPRAYS INTO BOTH NOSTRILS DAILY. (Patient taking differently: Place 2 sprays into both nostrils daily as needed for allergies.)   JARDIANCE 25 MG TABS tablet Take 25 mg by mouth daily.   losartan (COZAAR) 50 MG tablet Take 1 tablet (50 mg total) by mouth daily.   Melatonin 10 MG TABS Take 10 mg by mouth at bedtime as needed (sleep).    meloxicam (MOBIC) 15 MG tablet Take 15 mg by mouth daily.   Multiple Vitamin (MULTI-VITAMINS) TABS Take 1 tablet by mouth daily.    ONETOUCH DELICA LANCETS 48L MISC USE TO TEST BLOOD SUGAR TWICE A DAY AS DIRECTED   Secukinumab  150 MG/ML SOSY Inject 1 Stick into the skin every 30 (thirty) days.    Semaglutide, 1 MG/DOSE, (OZEMPIC, 1 MG/DOSE,) 2 MG/1.5ML SOPN Inject into the skin.   traZODone (DESYREL) 50 MG tablet TAKE 1 TABLET BY MOUTH EVERY DAY AT BEDTIME AS NEEDED FOR SLEEP   Vitamin D, Ergocalciferol, (DRISDOL) 1.25 MG (50000 UNIT) CAPS capsule Take 50,000 Units by mouth every 14 (fourteen) days.      ROS:  Review of Systems  Constitutional:  Negative for fatigue, fever and unexpected weight change.  Respiratory:  Negative for cough, shortness of breath and wheezing.   Cardiovascular:  Negative for chest pain, palpitations and leg swelling.  Gastrointestinal:  Negative for blood in stool, constipation, diarrhea, nausea and vomiting.  Endocrine: Negative for cold intolerance, heat intolerance and polyuria.  Genitourinary:  Negative for dyspareunia, dysuria, flank pain, frequency, genital sores, hematuria, menstrual problem, pelvic pain, urgency, vaginal bleeding, vaginal discharge and vaginal pain.  Musculoskeletal:  Negative for back pain, joint swelling and myalgias.  Skin:  Negative for rash.  Neurological:  Negative for dizziness, syncope, light-headedness, numbness and headaches.  Hematological:  Negative for adenopathy.  Psychiatric/Behavioral:  Negative for agitation, confusion, sleep disturbance and suicidal ideas. The patient is not nervous/anxious.     Objective: BP 124/80   Ht 5' 3"  (1.6 m)   Wt 163 lb (73.9 kg)   BMI 28.87 kg/m    Physical Exam Constitutional:      Appearance: She is well-developed.  Genitourinary:     Vulva normal.     Right Labia: rash.     Right Labia: No tenderness or lesions.    Left Labia: rash.     Left Labia: No tenderness or lesions.       No vaginal discharge, erythema or tenderness.      Right Adnexa: not tender and no mass present.    Left Adnexa: not tender and no mass present.    No cervical friability or polyp.     Uterus is not enlarged or  tender.  Breasts:    Right: No mass, nipple discharge, skin change or tenderness.     Left: No mass, nipple discharge, skin change or tenderness.  Neck:     Thyroid: No thyromegaly.  Cardiovascular:     Rate and Rhythm: Normal rate and regular rhythm.     Heart sounds: Normal heart sounds. No murmur heard. Pulmonary:     Effort: Pulmonary effort is normal.     Breath sounds: Normal breath sounds.  Abdominal:     Palpations: Abdomen is soft.     Tenderness: There is no abdominal tenderness. There is no guarding or rebound.  Musculoskeletal:        General: Normal range of motion.     Cervical back: Normal range of motion.  Lymphadenopathy:     Cervical: No cervical adenopathy.  Neurological:     General: No focal deficit present.     Mental Status: She is alert and oriented to person, place, and time.     Cranial Nerves: No cranial nerve deficit.  Skin:    General: Skin is warm and dry.  Psychiatric:        Mood and Affect: Mood normal.        Behavior: Behavior normal.        Thought Content: Thought content normal.        Judgment: Judgment normal.  Vitals reviewed.    Assessment/Plan: Encounter for annual routine gynecological examination  Encounter for screening mammogram for malignant neoplasm of breast - Plan: MM 3D SCREEN BREAST BILATERAL; pt to sched mammo  Screening for colon cancer - Plan: Ambulatory referral to Gastroenterology; refer back to GI for colonoscopy  Subacute vaginitis - Plan: fluconazole (DIFLUCAN) 150 MG tablet; pos ext exam, no d/c on internal exam. Rx diflucan, use clotrimazole ext BID prn sx. Keep dry, cotton underwear, limit pad use. F/u prn.   GYN counsel mammography screening, adequate intake of calcium  and vitamin D, diet and exercise    F/U  Return in about 1 year (around 05/12/2022).  Annalena Piatt B. Arlon Bleier, PA-C 05/12/2021 11:33 AM

## 2021-05-12 ENCOUNTER — Ambulatory Visit (INDEPENDENT_AMBULATORY_CARE_PROVIDER_SITE_OTHER): Payer: BC Managed Care – PPO | Admitting: Obstetrics and Gynecology

## 2021-05-12 ENCOUNTER — Encounter: Payer: Self-pay | Admitting: Obstetrics and Gynecology

## 2021-05-12 ENCOUNTER — Other Ambulatory Visit: Payer: Self-pay

## 2021-05-12 VITALS — BP 124/80 | Ht 63.0 in | Wt 163.0 lb

## 2021-05-12 DIAGNOSIS — Z01419 Encounter for gynecological examination (general) (routine) without abnormal findings: Secondary | ICD-10-CM

## 2021-05-12 DIAGNOSIS — Z1211 Encounter for screening for malignant neoplasm of colon: Secondary | ICD-10-CM | POA: Diagnosis not present

## 2021-05-12 DIAGNOSIS — N761 Subacute and chronic vaginitis: Secondary | ICD-10-CM | POA: Diagnosis not present

## 2021-05-12 DIAGNOSIS — Z1231 Encounter for screening mammogram for malignant neoplasm of breast: Secondary | ICD-10-CM

## 2021-05-12 MED ORDER — FLUCONAZOLE 150 MG PO TABS: 150.0000 mg | ORAL_TABLET | Freq: Once | ORAL | 0 refills | Status: DC

## 2021-05-20 ENCOUNTER — Telehealth: Payer: Self-pay

## 2021-05-20 NOTE — Telephone Encounter (Signed)
Called no answer left voicemail for  a call back 

## 2021-06-10 ENCOUNTER — Encounter: Payer: Self-pay | Admitting: Obstetrics and Gynecology

## 2021-06-11 ENCOUNTER — Other Ambulatory Visit: Payer: Self-pay | Admitting: Obstetrics and Gynecology

## 2021-06-11 MED ORDER — FLUCONAZOLE 150 MG PO TABS: 150.0000 mg | ORAL_TABLET | Freq: Once | ORAL | 0 refills | Status: AC

## 2021-08-19 IMAGING — MG DIGITAL SCREENING BILAT W/ TOMO W/ CAD
8 series · 8 of 24 positions shown · non-contrast
Comparison: Previous exam(s).

CLINICAL DATA: Screening.

EXAM:
DIGITAL SCREENING BILATERAL MAMMOGRAM WITH TOMO AND CAD

[L CC synth-2D]
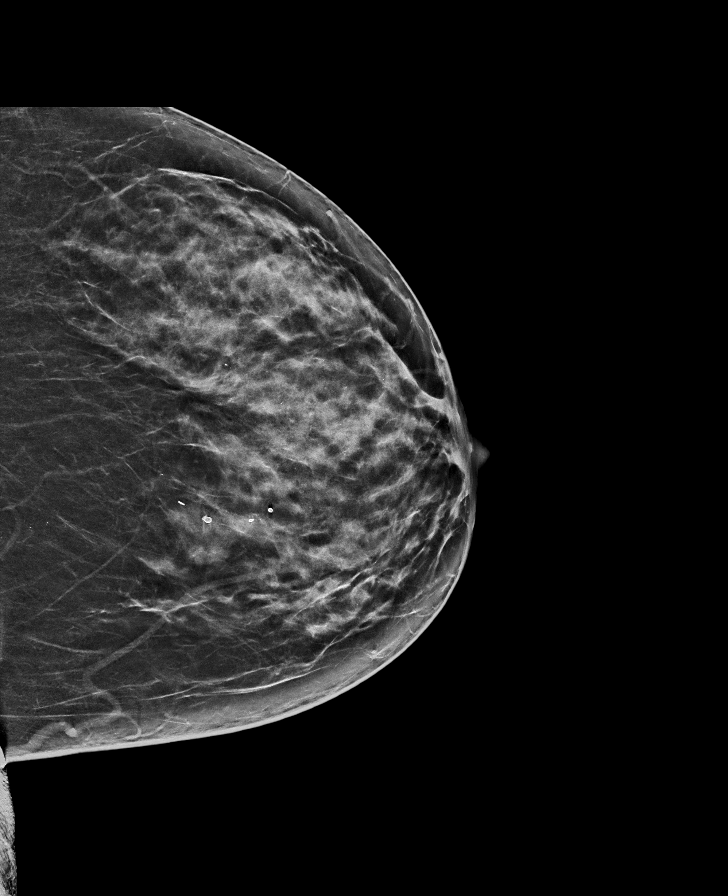

[R CC synth-2D]
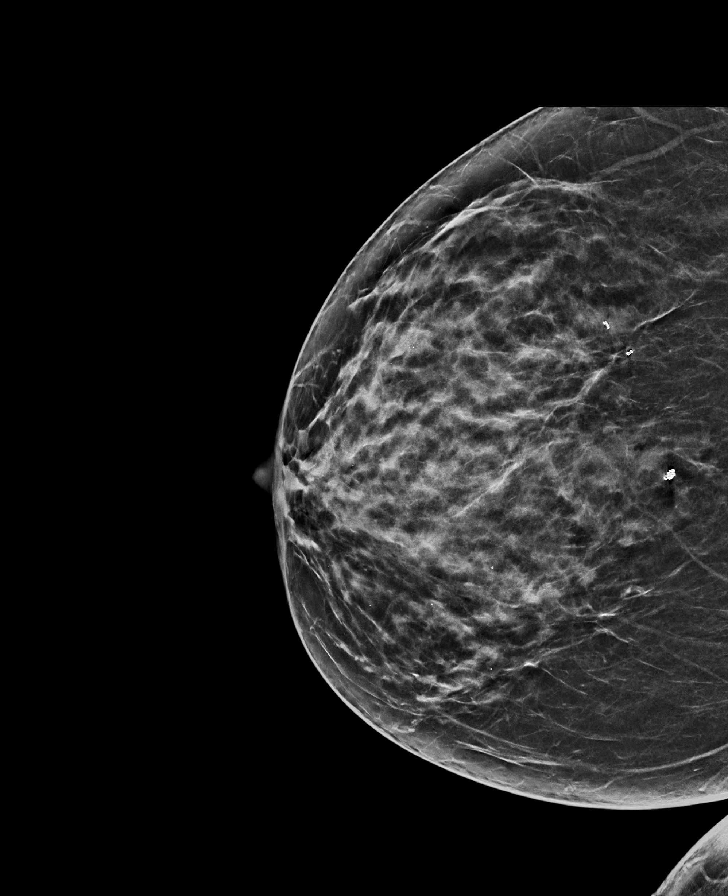

[L MLO synth-2D]
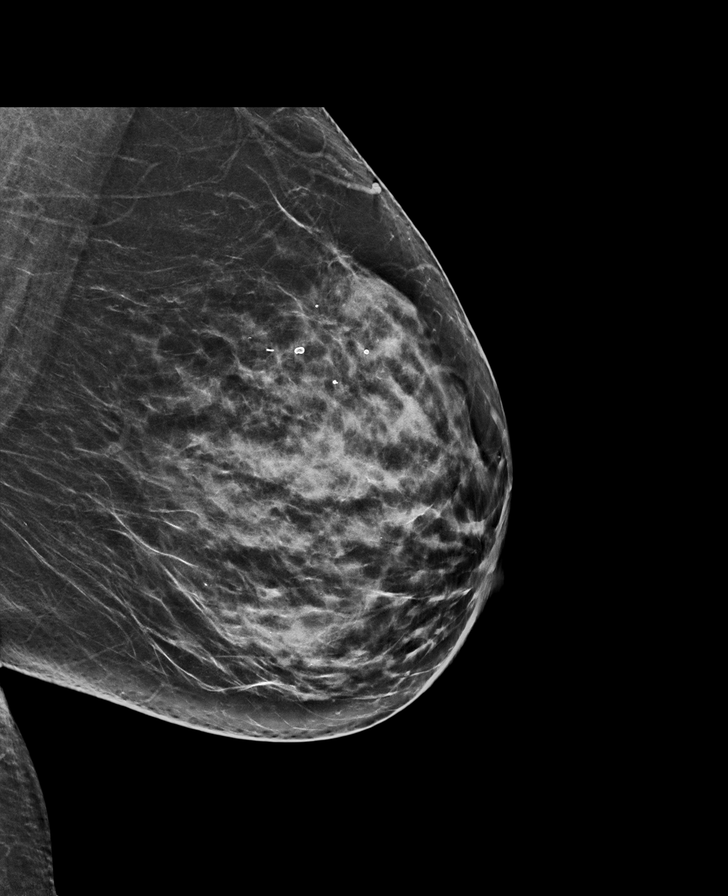

[R MLO synth-2D]
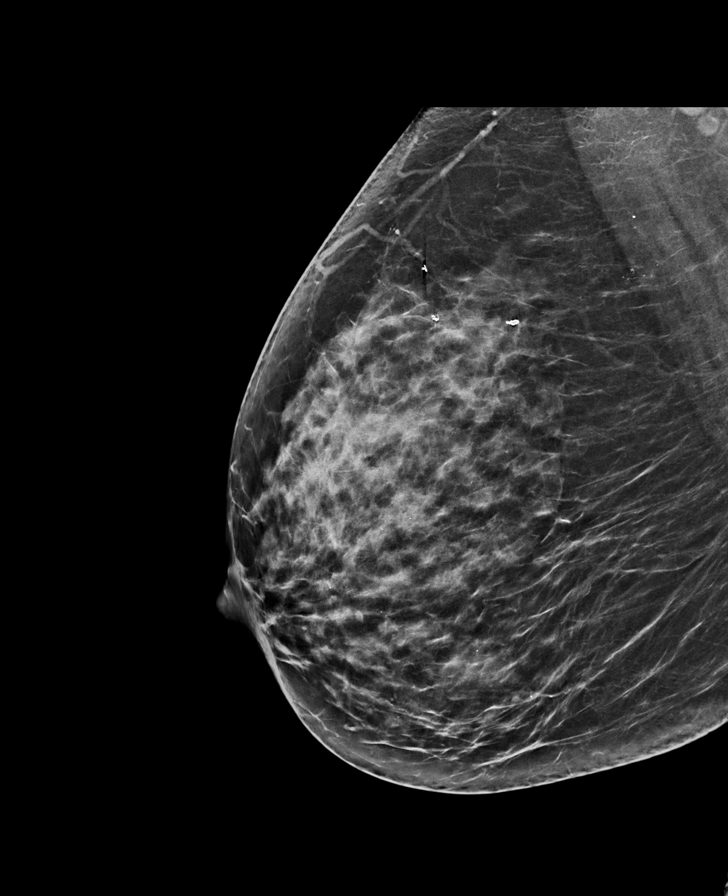

[R CC tomo · tomo slice 33/65.0]
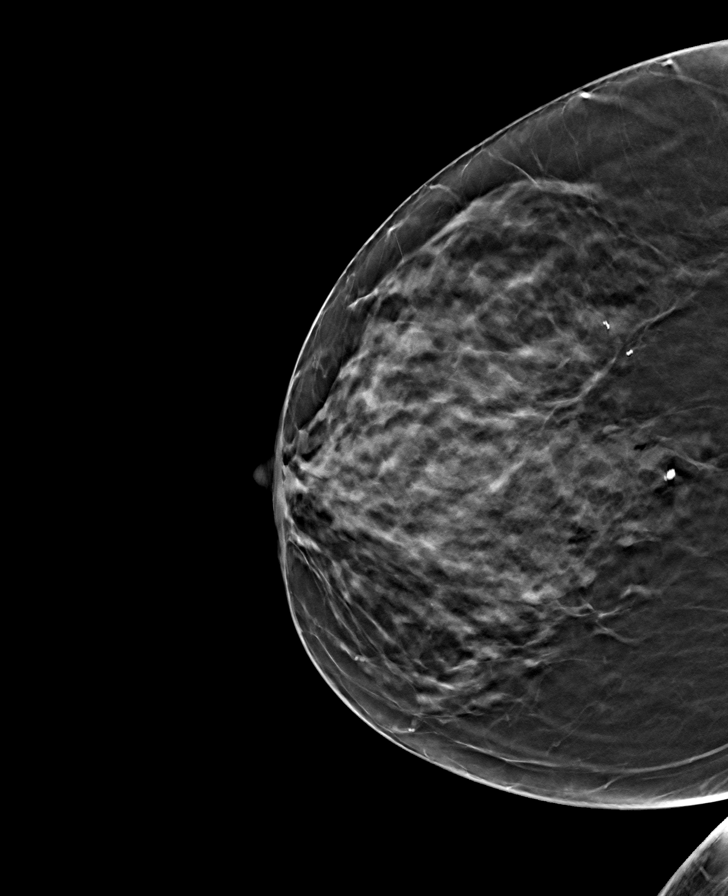

[L CC tomo · tomo slice 35/68.0]
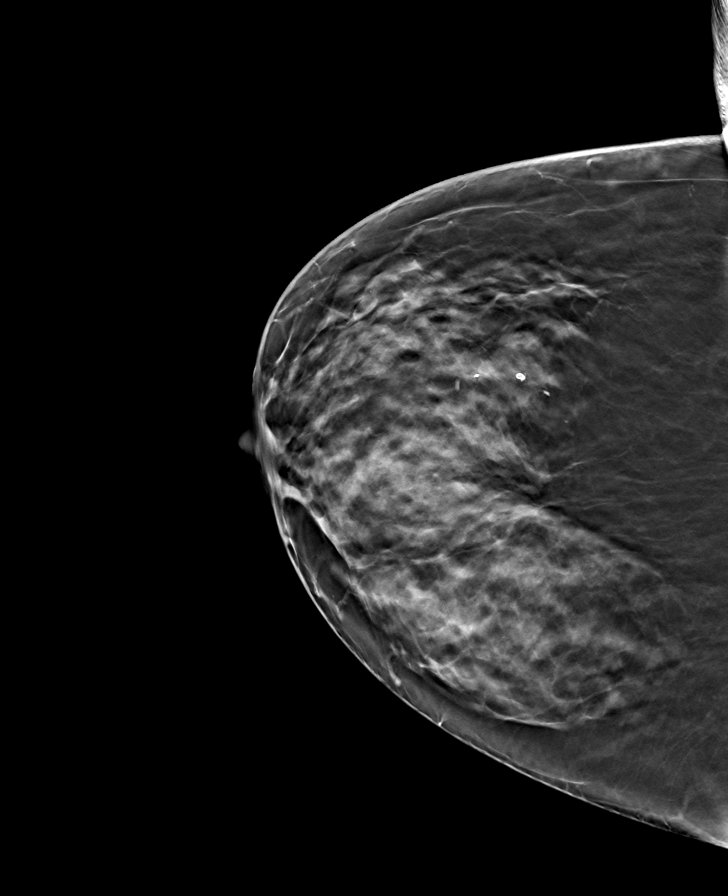

[R MLO tomo · tomo slice 38/75.0]
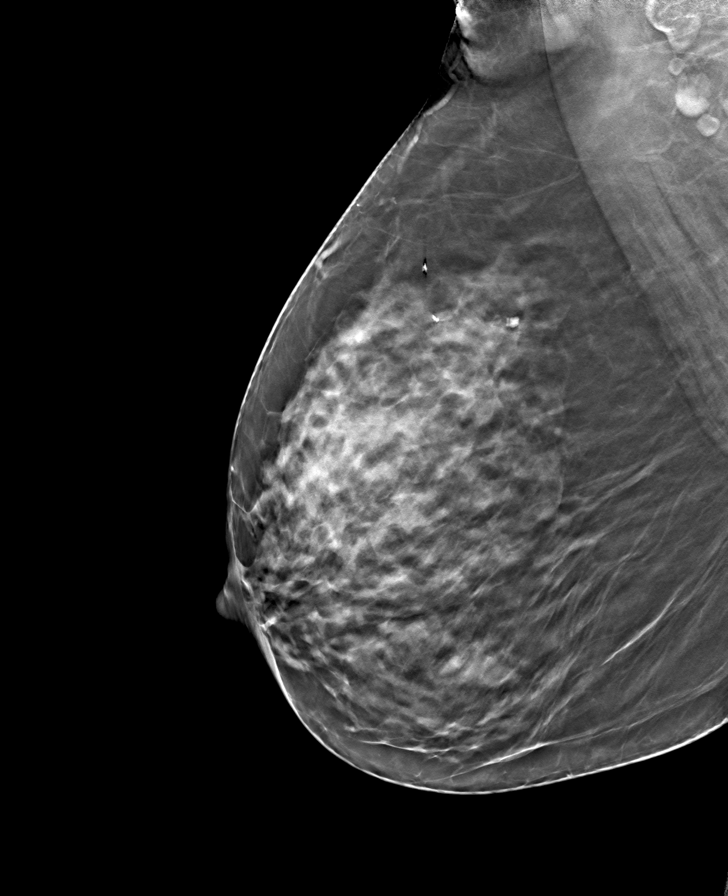

[L MLO tomo · tomo slice 37/72.0]
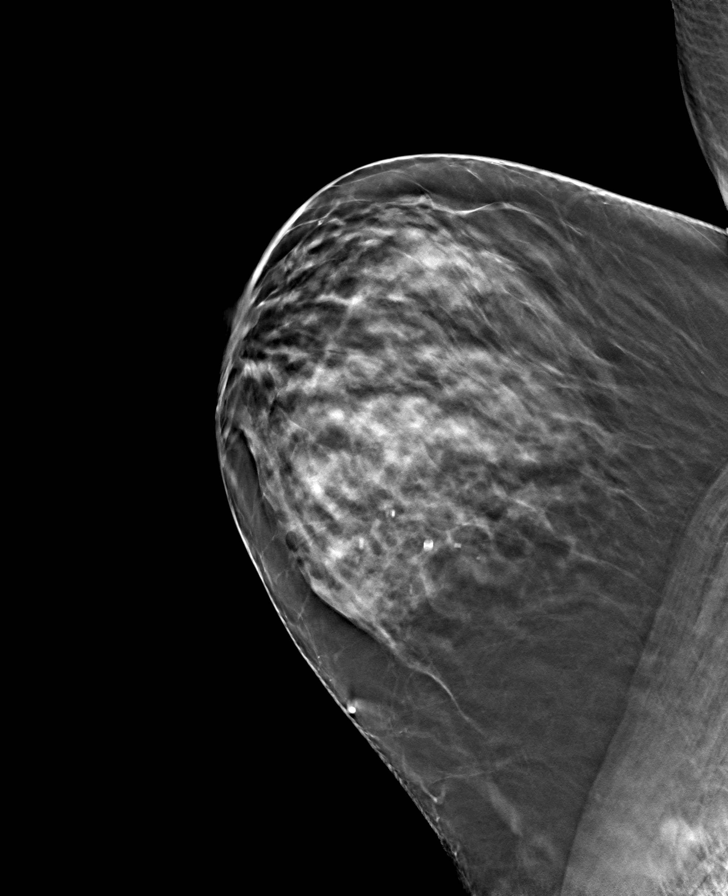

[8 of 24 positions shown; findings below may reference images not displayed]

ACR Breast Density Category c: The breast tissue is heterogeneously
dense, which may obscure small masses.
FINDINGS: There are no findings suspicious for malignancy. Images were
processed with CAD.
IMPRESSION: No mammographic evidence of malignancy. A result letter of this
screening mammogram will be mailed directly to the patient.

RECOMMENDATION:
Screening mammogram in one year. (Code:[id number]-U-LHB)

BI-RADS CATEGORY  1: Negative.

## 2021-12-17 ENCOUNTER — Encounter: Payer: Self-pay | Admitting: Obstetrics and Gynecology

## 2021-12-18 ENCOUNTER — Other Ambulatory Visit: Payer: Self-pay | Admitting: Obstetrics and Gynecology

## 2021-12-18 DIAGNOSIS — N761 Subacute and chronic vaginitis: Secondary | ICD-10-CM

## 2021-12-18 MED ORDER — FLUCONAZOLE 150 MG PO TABS: 150.0000 mg | ORAL_TABLET | Freq: Once | ORAL | 0 refills | Status: AC

## 2021-12-18 NOTE — Progress Notes (Signed)
Rx RF diflucan for yeast vag sx 

## 2022-04-29 ENCOUNTER — Ambulatory Visit
Admission: RE | Admit: 2022-04-29 | Discharge: 2022-04-29 | Disposition: A | Discharge status: Home / Self Care | Payer: BC Managed Care – PPO | Source: Ambulatory Visit | Attending: Obstetrics and Gynecology | Admitting: Obstetrics and Gynecology

## 2022-04-29 DIAGNOSIS — Z1231 Encounter for screening mammogram for malignant neoplasm of breast: Secondary | ICD-10-CM | POA: Diagnosis present

## 2022-05-02 ENCOUNTER — Other Ambulatory Visit: Payer: Self-pay | Admitting: Obstetrics and Gynecology

## 2022-05-02 DIAGNOSIS — R928 Other abnormal and inconclusive findings on diagnostic imaging of breast: Secondary | ICD-10-CM

## 2022-05-02 DIAGNOSIS — R921 Mammographic calcification found on diagnostic imaging of breast: Secondary | ICD-10-CM

## 2022-05-24 ENCOUNTER — Ambulatory Visit
Admission: RE | Admit: 2022-05-24 | Discharge: 2022-05-24 | Disposition: A | Discharge status: Home / Self Care | Payer: BC Managed Care – PPO | Source: Ambulatory Visit | Attending: Obstetrics and Gynecology | Admitting: Obstetrics and Gynecology

## 2022-05-24 DIAGNOSIS — R928 Other abnormal and inconclusive findings on diagnostic imaging of breast: Secondary | ICD-10-CM | POA: Insufficient documentation

## 2022-05-24 DIAGNOSIS — R921 Mammographic calcification found on diagnostic imaging of breast: Secondary | ICD-10-CM | POA: Insufficient documentation

## 2022-05-25 ENCOUNTER — Other Ambulatory Visit: Payer: Self-pay | Admitting: Obstetrics and Gynecology

## 2022-05-25 DIAGNOSIS — R921 Mammographic calcification found on diagnostic imaging of breast: Secondary | ICD-10-CM

## 2022-05-25 DIAGNOSIS — R928 Other abnormal and inconclusive findings on diagnostic imaging of breast: Secondary | ICD-10-CM

## 2022-06-08 ENCOUNTER — Ambulatory Visit
Admission: RE | Admit: 2022-06-08 | Discharge: 2022-06-08 | Disposition: A | Discharge status: Home / Self Care | Payer: BC Managed Care – PPO | Source: Ambulatory Visit | Attending: Obstetrics and Gynecology | Admitting: Obstetrics and Gynecology

## 2022-06-08 DIAGNOSIS — R921 Mammographic calcification found on diagnostic imaging of breast: Secondary | ICD-10-CM | POA: Diagnosis present

## 2022-06-08 DIAGNOSIS — R928 Other abnormal and inconclusive findings on diagnostic imaging of breast: Secondary | ICD-10-CM | POA: Insufficient documentation

## 2022-06-08 HISTORY — PX: BREAST BIOPSY: SHX20

## 2022-06-09 LAB — SURGICAL PATHOLOGY

## 2022-10-12 LAB — HEMOGLOBIN A1C: Hemoglobin A1C: 7

## 2022-12-07 ENCOUNTER — Encounter: Payer: Self-pay | Admitting: "Endocrinology

## 2022-12-07 ENCOUNTER — Ambulatory Visit: Payer: BC Managed Care – PPO | Admitting: "Endocrinology

## 2022-12-07 VITALS — BP 128/66 | HR 92 | Wt 163.4 lb

## 2022-12-07 DIAGNOSIS — E782 Mixed hyperlipidemia: Secondary | ICD-10-CM | POA: Diagnosis not present

## 2022-12-07 DIAGNOSIS — I10 Essential (primary) hypertension: Secondary | ICD-10-CM | POA: Diagnosis not present

## 2022-12-07 DIAGNOSIS — E1165 Type 2 diabetes mellitus with hyperglycemia: Secondary | ICD-10-CM | POA: Diagnosis not present

## 2022-12-07 MED ORDER — METFORMIN HCL ER 500 MG PO TB24: 500.0000 mg | ORAL_TABLET | Freq: Every day | ORAL | 1 refills | Status: DC

## 2022-12-07 NOTE — Patient Instructions (Signed)

## 2022-12-07 NOTE — Progress Notes (Unsigned)
Endocrinology Consult Note       12/08/2022, 7:01 PM   Subjective:    Patient ID: Michelle Mckenzie, female    DOB: 17-Jul-1961.  Michelle Mckenzie is being seen in consultation for management of currently uncontrolled symptomatic diabetes requested by  Dion Body, MD.   Past Medical History:  Diagnosis Date   Allergy    Anxiety    Asthma    No flares in 20 years   Axillary mass, right 09/05/2018   Diabetes mellitus without complication ([place])    Hypercholesterolemia    Hypertension    Insomnia    Osteopenia 11/19/2007   per dexa   PMS (premenstrual syndrome)    Psoriasis    Vitamin D deficiency 01/2013   per PCP;he wants [place] to manage    Past Surgical History:  Procedure Laterality Date   BREAST BIOPSY Right 11/20/2017   right breast stereo  fibroadenomatous change   BREAST BIOPSY Right 06/08/2022   Right breast stereo bx, ribbon clip - path pending   CHOLECYSTECTOMY     COLONOSCOPY  12/01/2009   Normal, Verdie Shire, MD [place]: 5-year follow-up exam recommended based on family history.   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 She attended [employer] and obtained her Bachelor's in [employer]. 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 She lives at home with her husband. They have 4 children Belenda Cruise [address] Will 20, [name], [place] 13). She spend time going to soccer games. She enjoys reading but her favorite thing to do is going to the beach.    Social Determinants of Health   Financial Resource Strain: Not on file  Food Insecurity: Not on file  Transportation Needs: Not on file  Physical Activity: Not on file  Stress: Not on file  Social Connections: Not on file    Family History  Problem Relation Age of Onset   Cancer Father        prostate cancer   Colon cancer Brother 36       gene neg   Arthritis  Maternal Grandmother    Stroke Maternal Grandmother    Arthritis Maternal Grandfather        RA   Arthritis Paternal Grandmother    Heart disease Paternal Grandmother 29       CAD - [place] - Died   Arthritis Paternal Grandfather    Breast cancer Neg Hx     Outpatient Encounter Medications as of 12/07/2022  Medication Sig   Probiotic Product (PROBIOTIC-10 PO) Take 1 tablet by mouth daily.   [DISCONTINUED] metFORMIN (GLUCOPHAGE-XR) 500 MG 24 hr tablet Take 500 mg by mouth 2 (two) times daily with a meal.   Blood Glucose Monitoring Suppl (FIFTY50 GLUCOSE METER 2.0) w/Device KIT Use to test blood sugar.  Please use whatever meter/strips are on formulary.   cetirizine (ZYRTEC) 10 MG tablet Take 10 mg by mouth daily.   citalopram (CELEXA) 10 MG tablet TAKE 1 TABLET BY MOUTH EVERY DAY (Patient taking differently: Take 10 mg by mouth daily.)   Continuous Blood Gluc Sensor (FREESTYLE LIBRE 14 DAY SENSOR) MISC Use 1 each every 14 (fourteen) days E11.65   fluticasone (FLONASE) 50 MCG/ACT nasal spray PLACE 2 SPRAYS INTO BOTH NOSTRILS DAILY.  (Patient taking differently: Place 2 sprays into both nostrils daily as needed for allergies.)   losartan (COZAAR) 50 MG tablet Take 1 tablet (50 mg total) by mouth daily.   lovastatin (MEVACOR) 40 MG tablet Take 40 mg by mouth at bedtime.    metFORMIN (GLUCOPHAGE-XR) 500 MG 24 hr tablet Take 1 tablet (500 mg total) by mouth daily with breakfast.   Multiple Vitamin (MULTI-VITAMINS) TABS Take 1 tablet by mouth daily.    ONETOUCH DELICA LANCETS 99991111 MISC USE TO TEST BLOOD SUGAR TWICE A DAY AS DIRECTED   Secukinumab 150 MG/ML SOSY Inject 1 Stick into the skin every 30 (thirty) days.    Semaglutide, 1 MG/DOSE, (OZEMPIC, 1 MG/DOSE,) 2 MG/1.5ML SOPN Inject 2 mg into the skin once a week.   traZODone (DESYREL) 50 MG tablet TAKE 1 TABLET BY MOUTH EVERY DAY AT BEDTIME AS NEEDED FOR SLEEP   Vitamin D, Ergocalciferol, (DRISDOL) 1.25 MG (50000 UNIT) CAPS capsule Take 50,000 Units by mouth every 14 (fourteen) days.   [DISCONTINUED] JARDIANCE 25 MG TABS tablet Take 25 mg by mouth daily.   [DISCONTINUED] Melatonin 10 MG TABS Take 10 mg by mouth at bedtime as needed (sleep).    [DISCONTINUED] meloxicam (MOBIC) 15 MG tablet Take 15 mg by mouth daily.   No facility-administered encounter medications on file as of 12/07/2022.    ALLERGIES: Allergies  Allergen Reactions   Cortisone Rash   Lanolin Rash    VACCINATION STATUS: Immunization History  Administered Date(s) Administered   Influenza Inj Mdck Quad Pf 06/25/2019   Influenza-Unspecified 07/06/2018   Moderna Sars-Covid-2 Vaccination 09/14/2020   PFIZER Comirnaty(Gray Top)Covid-19 Tri-Sucrose Vaccine 12/18/2019, 01/15/2020   PFIZER(Purple Top)SARS-COV-2 Vaccination 12/18/2019, 01/15/2020    Diabetes She presents for her initial diabetic visit. She has type 2 diabetes mellitus. Onset time: She was diagnosed at approximate age of 37 years. Her disease course has been fluctuating. There are no hypoglycemic associated symptoms. Pertinent negatives for  hypoglycemia include no confusion, headaches, pallor or seizures. There are no diabetic associated symptoms. Pertinent negatives for diabetes include no chest pain, no polydipsia, no polyphagia and no polyuria. There are no hypoglycemic complications. Symptoms are stable. There are no diabetic complications. Risk factors for coronary artery disease include dyslipidemia, diabetes mellitus, family history, hypertension and post-menopausal. Her weight is fluctuating minimally. Diabetic  meal planning: She follows a time restricted eating/intermittent fasting pattern of dieting. Exercise: Her sizes regularly.  She has a plan to have parathyroid and a long walk in [place] sometime in the years. Her home blood glucose trend is fluctuating minimally. Her breakfast blood glucose range is generally 140-180 mg/dl. Her lunch blood glucose range is generally 140-180 mg/dl. Her dinner blood glucose range is generally 140-180 mg/dl. Her bedtime blood glucose range is generally 130-140 mg/dl. Her overall blood glucose range is 140-180 mg/dl. (She wears a CGM device showing 86% time in range, 14% level 1 hyperglycemia.  No hypoglycemia.  Her recent A1c was 7%.) An ACE inhibitor/angiotensin II receptor blocker is being taken. Eye exam is current.  Hyperlipidemia This is a chronic problem. The current episode started more than 1 year ago. Exacerbating diseases include diabetes. Pertinent negatives include no chest pain, myalgias or shortness of breath. Risk factors for coronary artery disease include family history, dyslipidemia, diabetes mellitus, hypertension and post-menopausal.  Hypertension This is a chronic problem. Pertinent negatives include no chest pain, headaches, palpitations or shortness of breath. Risk factors for coronary artery disease include dyslipidemia, diabetes mellitus and post-menopausal state. Past treatments include angiotensin blockers.     Review of Systems  Constitutional:  Negative for chills,  fever and unexpected weight change.  HENT:  Negative for trouble swallowing and voice change.   Eyes:  Negative for visual disturbance.  Respiratory:  Negative for cough, shortness of breath and wheezing.   Cardiovascular:  Negative for chest pain, palpitations and leg swelling.  Gastrointestinal:  Negative for diarrhea, nausea and vomiting.  Endocrine: Negative for cold intolerance, heat intolerance, polydipsia, polyphagia and polyuria.  Musculoskeletal:  Negative for arthralgias and myalgias.  Skin:  Negative for color change, pallor, rash and wound.  Neurological:  Negative for seizures and headaches.  Psychiatric/Behavioral:  Negative for confusion and suicidal ideas.     Objective:       12/07/2022    2:39 PM 05/12/2021   10:29 AM 12/19/2019    4:13 PM  Vitals with BMI  Height  '5\' 3"'$  '5\' 3"'$   Weight 163 lbs 6 oz 163 lbs 165 lbs  BMI  [id number]  Systolic 0000000 [id number] 0000000  Diastolic 66 80 80  Pulse 92      BP 128/66   Pulse 92   Wt 163 lb 6.4 oz (74.1 kg)   BMI 28.95 kg/m   Wt Readings from Last 3 Encounters:  12/07/22 163 lb 6.4 oz (74.1 kg)  05/12/21 163 lb (73.9 kg)  12/19/19 165 lb (74.8 kg)     Physical Exam Constitutional:      Appearance: She is well-developed.  HENT:     Head: Normocephalic and atraumatic.  Neck:     Thyroid: No thyromegaly.     Trachea: No tracheal deviation.  Cardiovascular:     Rate and Rhythm: Normal rate and regular rhythm.  Pulmonary:     Effort: Pulmonary effort is normal.     Breath sounds: Normal breath sounds.  Abdominal:     General: Bowel sounds are normal.     Palpations: Abdomen is soft.     Tenderness: There is no abdominal tenderness. There is no guarding.  Musculoskeletal:        General: Normal range of motion.     Cervical back: Normal range of motion and neck supple.  Skin:    General: Skin is warm and dry.     Coloration: Skin is not pale.  Findings: No erythema or rash.  Neurological:     Mental Status: She  is alert and oriented to person, place, and time.     Cranial Nerves: No cranial nerve deficit.     Coordination: Coordination normal.     Deep Tendon Reflexes: Reflexes are normal and symmetric.  Psychiatric:        Judgment: Judgment normal.     CMP ( most recent) CMP     Component Value Date/Time   NA 136 10/24/2016 1533   K 3.8 10/24/2016 1533   CL 101 10/24/2016 1533   CO2 28 10/24/2016 1533   GLUCOSE 168 (H) 10/24/2016 1533   BUN 14 10/24/2016 1533   CREATININE 0.49 10/24/2016 1533   CALCIUM 9.6 10/24/2016 1533   PROT 7.4 10/24/2016 1533   ALBUMIN 4.2 10/24/2016 1533   AST 24 10/24/2016 1533   ALT 40 (H) 10/24/2016 1533   ALKPHOS 69 10/24/2016 1533   BILITOT 0.3 10/24/2016 1533    Diabetic Labs (most recent): Lab Results  Component Value Date   HGBA1C 7 10/12/2022   HGBA1C 6.5 10/20/2016    Assessment & Plan:   1. Type 2 diabetes mellitus with hyperglycemia, without long-term current use of insulin ([place])  - Michelle Mckenzie has currently uncontrolled symptomatic type 2 DM since  62 years of age,  with most recent A1c of 7 %. Recent labs reviewed. - I had a long discussion with her about the possible risk factors and  the pathology behind its diabetes and its complications. -her diabetes is complicated by morbid hyperlipidemia and hypertension  and she remains at a high risk for more acute and chronic complications which include CAD, CVA, CKD, retinopathy, and neuropathy. These are all discussed in detail with her.  - I discussed all available options of managing her diabetes including de-escalation of medications. I have counseled her on Food as Medicine by adopting a Whole Food , Plant Predominant  ( WFPP) nutrition as recommended by [employer] of Lifestyle Medicine. Patient is encouraged to switch to  unprocessed or minimally processed  complex starch, adequate protein intake (mainly plant source), minimal liquid fat, plenty of fruits, and vegetables. -  she  is advised to stick to a routine mealtimes to eat 3 complete meals a day and snack only when necessary ( to snack only to correct hypoglycemia BG <70 day time or <100 at night).   - she acknowledges that there is a room for improvement in her food and drink choices. - Further Specific Suggestion is made for her to avoid simple carbohydrates  from her diet including Cakes, Sweet Desserts, Ice Cream, Soda (diet and regular), Sweet Tea, Candies, Chips, Cookies, Store Bought Juices, Alcohol ,  Artificial Sweeteners,  Coffee Creamer, and "Sugar-free" Products. This will help patient to have more stable blood glucose profile and potentially avoid unintended weight gain.  The following Lifestyle Medicine recommendations according to [facility]) were discussed and offered to patient and she agrees to start the journey:  A.  Whole Foods, Plant-based plate comprising of fruits and vegetables, plant-based proteins, whole-grain carbohydrates was discussed in detail with the patient.   A list for source of those nutrients were also provided to the patient.  Patient will use only water or unsweetened tea for hydration. B.  The need to stay away from risky substances including alcohol, smoking; obtaining 7 to 9 hours of restorative sleep, at least 150 minutes of moderate intensity exercise weekly, the  importance of healthy social connections,  and stress reduction techniques were discussed. C.  A full color page of  Calorie density of various food groups per pound showing examples of each food groups was provided to the patient.  - she will be scheduled with Jearld Fenton, RDN, CDE for individualized diabetes education.  - I have approached her with the following individualized plan to manage  her diabetes and patient agrees:   -In light of her motivation to change her lifestyle and avoid further escalation in medications, she will be assisted to accomplish this course.  She is  considered for simplified treatment regimen.  Accordingly, she is advised to discontinue Jardiance which she ran out, advised to continue metformin 500 mg XR p.o. daily at breakfast, continue Ozempic 2 mg subcutaneously weekly.   She is going to continue to use her CGM, advised to call clinic for uncontrolled glycemic profiles less than 70 mg per DL or greater than 200 mg per DL of weekly average.  -She has several options if she cannot control diabetes to target options including resuming Jardiance.    - Specific targets for  A1c;  LDL, HDL,  and Triglycerides were discussed with the patient.  2) Blood Pressure /Hypertension:  her blood pressure is  controlled to target.   she is advised to continue her current medications including losartan 50 mg p.o. daily, whole food plant-based diet discussed above will also help her control hypertension without further escalation of medications.     3) Lipids/Hyperlipidemia: Does not have recent lipid panel to review.  She is advised to continue lovastatin 40 mg p.o. nightly.  She will be considered for fasting lipid panel on subsequent visits.   4)  Weight/Diet:  Body mass index is 28.95 kg/m.  -   she is  a candidate for  some weight loss. I discussed with her the fact that loss of 5 - 10% of her  current body weight will have the most impact on her diabetes management.  The above detailed  ACLM recommendations for nutrition, exercise, sleep, social life, avoidance of risky substances, the need for restorative sleep   information will also detailed on discharge instructions.  5) Chronic Care/Health Maintenance:  -she  is on ACEI/ARB and Statin medications and  is encouraged to initiate and continue to follow up with Ophthalmology, Dentist,  Podiatrist at least yearly or according to recommendations, and advised to   stay away from smoking. I have recommended yearly flu vaccine and pneumonia vaccine at least every 5 years; moderate intensity exercise for up  to 150 minutes weekly; and  sleep for 7- 9 hours a day.  - she is  advised to maintain close follow up with Dion Body, MD for primary care needs, as well as her other providers for optimal and coordinated care.   I spent 45 minutes in the care of the patient today including review of labs from [place], Lipids, Thyroid Function, Hematology (current and previous including abstractions from other facilities); face-to-face time discussing  her blood glucose readings/logs, discussing hypoglycemia and hyperglycemia episodes and symptoms, medications doses, her options of short and long term treatment based on the latest standards of care / guidelines;  discussion about incorporating lifestyle medicine;  and documenting the encounter. Risk reduction counseling performed per USPSTF guidelines to reduce  obesity and cardiovascular risk factors.  Her diabetic foot exam today is normal.    Please refer to Patient Instructions for Blood Glucose Monitoring and Insulin/Medications Dosing Guide"  in media tab for additional information. Please  also refer to " Patient Self Inventory" in the Media  tab for reviewed elements of pertinent patient history.  Michelle Mckenzie participated in the discussions, expressed understanding, and voiced agreement with the above plans.  All questions were answered to her satisfaction. she is encouraged to contact clinic should she have any questions or concerns prior to her return visit.   Follow up plan: - Return in about 9 weeks (around 02/08/2023) for Bring Meter/CGM Device/Logs- A1c in Office.  Glade Lloyd, MD [facility] [address] [place],  [address] Phone: [phone]  Fax: [phone]    12/08/2022, 7:01 PM  This note was partially dictated with voice recognition software. Similar sounding words can be transcribed inadequately or may not  be corrected upon review.

## 2023-01-03 ENCOUNTER — Encounter: Payer: Self-pay | Admitting: "Endocrinology

## 2023-01-17 ENCOUNTER — Ambulatory Visit: Payer: Self-pay | Admitting: Nurse Practitioner

## 2023-02-01 ENCOUNTER — Ambulatory Visit: Payer: Self-pay | Admitting: "Endocrinology

## 2023-02-01 ENCOUNTER — Ambulatory Visit: Payer: BC Managed Care – PPO | Admitting: Podiatry

## 2023-02-08 ENCOUNTER — Encounter: Payer: Self-pay | Admitting: "Endocrinology

## 2023-02-08 ENCOUNTER — Ambulatory Visit: Payer: BC Managed Care – PPO | Admitting: "Endocrinology

## 2023-02-08 VITALS — BP 96/78 | HR 80 | Ht 63.0 in | Wt 164.4 lb

## 2023-02-08 DIAGNOSIS — E782 Mixed hyperlipidemia: Secondary | ICD-10-CM

## 2023-02-08 DIAGNOSIS — I10 Essential (primary) hypertension: Secondary | ICD-10-CM | POA: Diagnosis not present

## 2023-02-08 DIAGNOSIS — Z7984 Long term (current) use of oral hypoglycemic drugs: Secondary | ICD-10-CM | POA: Diagnosis not present

## 2023-02-08 DIAGNOSIS — E1165 Type 2 diabetes mellitus with hyperglycemia: Secondary | ICD-10-CM | POA: Diagnosis not present

## 2023-02-08 LAB — POCT GLYCOSYLATED HEMOGLOBIN (HGB A1C): HbA1c, POC (controlled diabetic range): 7.4 % — AB (ref 0.0–7.0)

## 2023-02-08 NOTE — Progress Notes (Unsigned)
Endocrinology Consult Note       02/08/2023, 5:26 PM   Subjective:    Patient ID: Michelle Mckenzie, female    DOB: 04/24/1961.  Michelle Mckenzie is being seen in consultation for management of currently uncontrolled symptomatic diabetes requested by  [name], MD.   Past Medical History:  Diagnosis Date   Allergy    Anxiety    Asthma    No flares in 20 years   Axillary mass, right 09/05/2018   Diabetes mellitus without complication (HCC)    Hypercholesterolemia    Hypertension    Insomnia    Osteopenia 11/19/2007   per dexa   PMS (premenstrual syndrome)    Psoriasis    Vitamin D deficiency 01/2013   per PCP;he wants [place] to manage    Past Surgical History:  Procedure Laterality Date   BREAST BIOPSY Right 11/20/2017   right breast stereo  fibroadenomatous change   BREAST BIOPSY Right 06/08/2022   Right breast stereo bx, ribbon clip - path pending   CHOLECYSTECTOMY     COLONOSCOPY  12/01/2009   Normal, [name], MD MI: 5-year follow-up exam recommended based on family history.   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 She attended [employer] and obtained her Bachelor's in [employer]. 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 She lives at home with her husband. They have 4 children Michelle Mckenzie [address] Michelle Mckenzie [address], Michelle Mckenzie 15, [place] 13). She spend time going to soccer games. She enjoys reading but her favorite thing to do is going to the beach.    Social Determinants of Health   Financial Resource Strain: Not on file  Food Insecurity: Not on file  Transportation Needs: Not on file  Physical Activity: Not on file  Stress: Not on file  Social Connections: Not on file    Family History  Problem Relation Age of Onset   Cancer Father        prostate cancer   Colon cancer Brother 36       gene neg   Arthritis  Maternal Grandmother    Stroke Maternal Grandmother    Arthritis Maternal Grandfather        RA   Arthritis Paternal Grandmother    Heart disease Paternal Grandmother 8       CAD - MI - Died   Arthritis Paternal Grandfather    Breast cancer Neg Hx     Outpatient Encounter Medications as of 02/08/2023  Medication Sig   Blood Glucose Monitoring Suppl (FIFTY50 GLUCOSE METER 2.0) w/Device KIT Use to test blood sugar.  Please use whatever meter/strips are on formulary.   cetirizine (ZYRTEC) 10 MG tablet Take 10 mg by mouth daily.   citalopram (CELEXA) 10 MG tablet TAKE 1 TABLET BY MOUTH EVERY DAY (Patient taking differently: Take 10 mg by mouth daily.)   Continuous Blood Gluc Sensor (FREESTYLE LIBRE 14 DAY SENSOR) MISC Use 1 each every 14 (fourteen) days E11.65   fluticasone (FLONASE) 50 MCG/ACT nasal spray PLACE 2 SPRAYS INTO BOTH NOSTRILS DAILY. (Patient taking differently: Place 2 sprays into both nostrils daily as needed for allergies.)   JARDIANCE 25 MG TABS tablet Take 25 mg by mouth daily.   losartan (COZAAR) 50 MG tablet  Take 1 tablet (50 mg total) by mouth daily.   lovastatin (MEVACOR) 40 MG tablet Take 40 mg by mouth at bedtime.    metFORMIN (GLUCOPHAGE-XR) 500 MG 24 hr tablet Take 1 tablet (500 mg total) by mouth daily with breakfast.   Multiple Vitamin (MULTI-VITAMINS) TABS Take 1 tablet by mouth daily.    ONETOUCH DELICA LANCETS 33G MISC USE TO TEST BLOOD SUGAR TWICE A DAY AS DIRECTED   Probiotic Product (PROBIOTIC-10 PO) Take 1 tablet by mouth daily.   Secukinumab 150 MG/ML SOSY Inject 1 Stick into the skin every 30 (thirty) days.    traZODone (DESYREL) 50 MG tablet TAKE 1 TABLET BY MOUTH EVERY DAY AT BEDTIME AS NEEDED FOR SLEEP   Vitamin D, Ergocalciferol, (DRISDOL) 1.25 MG (50000 UNIT) CAPS capsule Take 50,000 Units by mouth every 14 (fourteen) days.   [DISCONTINUED] Semaglutide, 1 MG/DOSE, (OZEMPIC, 1 MG/DOSE,) 2 MG/1.5ML SOPN Inject 2 mg into the skin once a week.   No facility-administered encounter medications on file as of 02/08/2023.    ALLERGIES: Allergies  Allergen Reactions   Cortisone Rash   Lanolin Rash    VACCINATION STATUS: Immunization History  Administered Date(s) Administered   Influenza Inj Mdck Quad Pf 06/25/2019   Influenza-Unspecified 07/06/2018   Moderna Sars-Covid-2 Vaccination 09/14/2020   PFIZER Comirnaty(Gray Top)Covid-19 Tri-Sucrose Vaccine 12/18/2019, 01/15/2020   PFIZER(Purple Top)SARS-COV-2 Vaccination 12/18/2019, 01/15/2020    Diabetes She presents for her initial diabetic visit. She has type 2 diabetes mellitus. Onset time: She was diagnosed at approximate age of 62 years. Her disease course has been fluctuating. There are no hypoglycemic associated symptoms. Pertinent negatives for hypoglycemia include no confusion, headaches, pallor or seizures. There are no diabetic associated symptoms. Pertinent negatives for diabetes include no chest pain, no polydipsia, no polyphagia and no polyuria. There are no hypoglycemic complications. Symptoms are stable. There are no  diabetic complications. Risk factors for coronary artery disease include dyslipidemia, diabetes mellitus, family history, hypertension and post-menopausal. Her weight is fluctuating minimally. Diabetic meal planning: She follows a time restricted eating/intermittent fasting pattern of dieting. Exercise: Her sizes regularly.  She has a plan to have parathyroid and a long walk in [place] sometime in the years. Her home blood glucose trend is fluctuating minimally. Her breakfast blood glucose range is generally 140-180 mg/dl. Her lunch blood  glucose range is generally 140-180 mg/dl. Her dinner blood glucose range is generally 140-180 mg/dl. Her bedtime blood glucose range is generally 130-140 mg/dl. Her overall blood glucose range is 140-180 mg/dl. (She wears a CGM device showing 86% time in range, 14% level 1 hyperglycemia.  No hypoglycemia.  Her recent A1c was 7%.) An ACE inhibitor/angiotensin II receptor blocker is being taken. Eye exam is current.  Hyperlipidemia This is a chronic problem. The current episode started more than 1 year ago. Exacerbating diseases include diabetes. Pertinent negatives include no chest pain, myalgias or shortness of breath. Risk factors for coronary artery disease include family history, dyslipidemia, diabetes mellitus, hypertension and post-menopausal.  Hypertension This is a chronic problem. Pertinent negatives include no chest pain, headaches, palpitations or shortness of breath. Risk factors for coronary artery disease include dyslipidemia, diabetes mellitus and post-menopausal state. Past treatments include angiotensin blockers.     Review of Systems  Constitutional:  Negative for chills, fever and unexpected weight change.  HENT:  Negative for trouble swallowing and voice change.   Eyes:  Negative for visual disturbance.  Respiratory:  Negative for cough, shortness of breath and wheezing.   Cardiovascular:  Negative for chest pain, palpitations and leg swelling.   Gastrointestinal:  Negative for diarrhea, nausea and vomiting.  Endocrine: Negative for cold intolerance, heat intolerance, polydipsia, polyphagia and polyuria.  Musculoskeletal:  Negative for arthralgias and myalgias.  Skin:  Negative for color change, pallor, rash and wound.  Neurological:  Negative for seizures and headaches.  Psychiatric/Behavioral:  Negative for confusion and suicidal ideas.     Objective:       02/08/2023    3:25 PM 12/07/2022    2:39 PM 05/12/2021   10:29 AM  Vitals with BMI  Height 5\' 3"   5\' 3"   Weight 164 lbs 6 oz 163 lbs 6 oz 163 lbs  BMI 29.13  28.88  Systolic 96 128 124  Diastolic 78 66 80  Pulse 80 92     BP 96/78   Pulse 80   Ht 5\' 3"  (1.6 m)   Wt 164 lb 6.4 oz (74.6 kg)   BMI 29.12 kg/m   Wt Readings from Last 3 Encounters:  02/08/23 164 lb 6.4 oz (74.6 kg)  12/07/22 163 lb 6.4 oz (74.1 kg)  05/12/21 163 lb (73.9 kg)     Physical Exam Constitutional:      Appearance: She is well-developed.  HENT:     Head: Normocephalic and atraumatic.  Neck:     Thyroid: No thyromegaly.     Trachea: No tracheal deviation.  Cardiovascular:     Rate and Rhythm: Normal rate and regular rhythm.  Pulmonary:     Effort: Pulmonary effort is normal.     Breath sounds: Normal breath sounds.  Abdominal:     General: Bowel sounds are normal.     Palpations: Abdomen is soft.     Tenderness: There is no abdominal tenderness. There is no guarding.  Musculoskeletal:        General: Normal range of motion.     Cervical back: Normal range of motion and neck supple.  Skin:    General: Skin is warm and dry.     Coloration: Skin is not pale.     Findings: No erythema or rash.  Neurological:     Mental Status: She is alert and oriented to person, place, and time.     Cranial Nerves: No cranial nerve deficit.     Coordination: Coordination normal.  Deep Tendon Reflexes: Reflexes are normal and symmetric.  Psychiatric:        Judgment: Judgment normal.      CMP ( most recent) CMP     Component Value Date/Time   NA 136 10/24/2016 1533   K 3.8 10/24/2016 1533   CL 101 10/24/2016 1533   CO2 28 10/24/2016 1533   GLUCOSE 168 (H) 10/24/2016 1533   BUN 14 10/24/2016 1533   CREATININE 0.49 10/24/2016 1533   CALCIUM 9.6 10/24/2016 1533   PROT 7.4 10/24/2016 1533   ALBUMIN 4.2 10/24/2016 1533   AST 24 10/24/2016 1533   ALT 40 (H) 10/24/2016 1533   ALKPHOS 69 10/24/2016 1533   BILITOT 0.3 10/24/2016 1533    Diabetic Labs (most recent): Lab Results  Component Value Date   HGBA1C 7.4 (A) 02/08/2023   HGBA1C 7 10/12/2022   HGBA1C 6.5 10/20/2016    Assessment & Plan:   1. Type 2 diabetes mellitus with hyperglycemia, without long-term current use of insulin (HCC)  - SYRENITY KUZNICKI has currently uncontrolled symptomatic type 2 DM since  62 years of age,  with most recent A1c of 7 %. Recent labs reviewed. - I had a long discussion with her about the possible risk factors and  the pathology behind its diabetes and its complications. -her diabetes is complicated by morbid hyperlipidemia and hypertension  and she remains at a high risk for more acute and chronic complications which include CAD, CVA, CKD, retinopathy, and neuropathy. These are all discussed in detail with her.  - I discussed all available options of managing her diabetes including de-escalation of medications. I have counseled her on Food as Medicine by adopting a Whole Food , Plant Predominant  ( WFPP) nutrition as recommended by [employer] of Lifestyle Medicine. Patient is encouraged to switch to  unprocessed or minimally processed  complex starch, adequate protein intake (mainly plant source), minimal liquid fat, plenty of fruits, and vegetables. -  she is advised to stick to a routine mealtimes to eat 3 complete meals a day and snack only when necessary ( to snack only to correct hypoglycemia BG <70 day time or <100 at night).   - she acknowledges that there is a  room for improvement in her food and drink choices. - Further Specific Suggestion is made for her to avoid simple carbohydrates  from her diet including Cakes, Sweet Desserts, Ice Cream, Soda (diet and regular), Sweet Tea, Candies, Chips, Cookies, Store Bought Juices, Alcohol ,  Artificial Sweeteners,  Coffee Creamer, and "Sugar-free" Products. This Michelle Mckenzie help patient to have more stable blood glucose profile and potentially avoid unintended weight gain.  The following Lifestyle Medicine recommendations according to American College of Lifestyle Medicine [facility]) were discussed and offered to patient and she agrees to start the journey:  A.  Whole Foods, Plant-based plate comprising of fruits and vegetables, plant-based proteins, whole-grain carbohydrates was discussed in detail with the patient.   A list for source of those nutrients were also provided to the patient.  Patient Michelle Mckenzie use only water or unsweetened tea for hydration. B.  The need to stay away from risky substances including alcohol, smoking; obtaining 7 to 9 hours of restorative sleep, at least 150 minutes of moderate intensity exercise weekly, the importance of healthy social connections,  and stress reduction techniques were discussed. C.  A full color page of  Calorie density of various food groups per pound showing examples of each food groups was provided to the  patient.  - she Michelle Mckenzie be scheduled with Norm Salt, RDN, CDE for individualized diabetes education.  - I have approached her with the following individualized plan to manage  her diabetes and patient agrees:   -In light of her motivation to change her lifestyle and avoid further escalation in medications, she Michelle Mckenzie be assisted to accomplish this course.  She is considered for simplified treatment regimen.  Accordingly, she is advised to discontinue Jardiance which she ran out, advised to continue metformin 500 mg XR p.o. daily at breakfast, continue Ozempic 2 mg subcutaneously  weekly.   She is going to continue to use her CGM, advised to call clinic for uncontrolled glycemic profiles less than 70 mg per DL or greater than 161 mg per DL of weekly average.  -She has several options if she cannot control diabetes to target options including resuming Jardiance.    - Specific targets for  A1c;  LDL, HDL,  and Triglycerides were discussed with the patient.  2) Blood Pressure /Hypertension:  her blood pressure is  controlled to target.   she is advised to continue her current medications including losartan 50 mg p.o. daily, whole food plant-based diet discussed above Michelle Mckenzie also help her control hypertension without further escalation of medications.     3) Lipids/Hyperlipidemia: Does not have recent lipid panel to review.  She is advised to continue lovastatin 40 mg p.o. nightly.  She Michelle Mckenzie be considered for fasting lipid panel on subsequent visits.   4)  Weight/Diet:  Body mass index is 29.12 kg/m.  -   she is  a candidate for  some weight loss. I discussed with her the fact that loss of 5 - 10% of her  current body weight Michelle Mckenzie have the most impact on her diabetes management.  The above detailed  ACLM recommendations for nutrition, exercise, sleep, social life, avoidance of risky substances, the need for restorative sleep   information Michelle Mckenzie also detailed on discharge instructions.  5) Chronic Care/Health Maintenance:  -she  is on ACEI/ARB and Statin medications and  is encouraged to initiate and continue to follow up with Ophthalmology, Dentist,  Podiatrist at least yearly or according to recommendations, and advised to   stay away from smoking. I have recommended yearly flu vaccine and pneumonia vaccine at least every 5 years; moderate intensity exercise for up to 150 minutes weekly; and  sleep for 7- 9 hours a day.  - she is  advised to maintain close follow up with [name], MD for primary care needs, as well as her other providers for optimal and coordinated  care.   I spent 45 minutes in the care of the patient today including review of labs from CMP, Lipids, Thyroid Function, Hematology (current and previous including abstractions from other facilities); face-to-face time discussing  her blood glucose readings/logs, discussing hypoglycemia and hyperglycemia episodes and symptoms, medications doses, her options of short and long term treatment based on the latest standards of care / guidelines;  discussion about incorporating lifestyle medicine;  and documenting the encounter. Risk reduction counseling performed per USPSTF guidelines to reduce  obesity and cardiovascular risk factors.  Her diabetic foot exam today is normal.    Please refer to Patient Instructions for Blood Glucose Monitoring and Insulin/Medications Dosing Guide"  in media tab for additional information. Please  also refer to " Patient Self Inventory" in the Media  tab for reviewed elements of pertinent patient history.  Jerl Mina participated in the discussions, expressed understanding, and  voiced agreement with the above plans.  All questions were answered to her satisfaction. she is encouraged to contact clinic should she have any questions or concerns prior to her return visit.   Follow up plan: - Return in about 3 months (around 05/11/2023) for F/U with Pre-visit Labs, Meter/CGM/Logs, A1c here.  Marquis Lunch, MD [facility] [address] [place], [place] [address] Phone: [phone]  Fax: [phone]    02/08/2023, 5:26 PM  This note was partially dictated with voice recognition software. Similar sounding words can be transcribed inadequately or may not  be corrected upon review.

## 2023-02-08 NOTE — Patient Instructions (Signed)

## 2023-02-16 ENCOUNTER — Encounter: Payer: Self-pay | Admitting: "Endocrinology

## 2023-02-16 ENCOUNTER — Other Ambulatory Visit: Payer: Self-pay

## 2023-02-16 DIAGNOSIS — E1165 Type 2 diabetes mellitus with hyperglycemia: Secondary | ICD-10-CM

## 2023-02-16 MED ORDER — JARDIANCE 25 MG PO TABS: 25.0000 mg | ORAL_TABLET | Freq: Every day | ORAL | 0 refills | Status: DC

## 2023-03-15 NOTE — Progress Notes (Signed)
PCP: Michelle Ivan, MD   Chief Complaint  Patient presents with   Gynecologic Exam    No concerns    HPI:      [name] is a 62 y.o. No obstetric history on file. who LMP was No LMP recorded. Patient is postmenopausal., presents today for her annual examination.  Her menses are absent due to menopause. She does not have PMB. She does not have vasomotor sx.   Sex activity: single partner, contraception - post menopausal status. She does have vaginal dryness, improved with lubricants.  Last Pap: 08/27/18 Results were: no abnormalities /neg HPV DNA.  Hx of STDs: none  Last mammogram: 06/08/22 Results were: normal after neg breast bx; repeat in 12 months.  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  Pt with RT axillary mass for many yrs; larger and more tender last yr. Had exc with Dr. Lemar Livings and shown to be benign fatty tissue.  There is no FH of breast cancer. There is no FH of ovarian cancer. The patient does do self-breast exams.  Colonoscopy: colonoscopy 10/2021  at [facility], no polyps. Thinks due again in 10 yrs. 2011 without abnormalities with Dr. Bluford Mckenzie.  Repeat due after 10 years. 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 Drinks lots of water daily, couldn't void regularly. No sx now that retired.  Tobacco use: The patient denies current or previous tobacco use. Alcohol use: occas No drug use Exercise: moderately active  She does get adequate calcium and Vitamin D in her diet.  She has labs with PCP.   Past Medical History:  Diagnosis Date   Allergy    Anxiety    Asthma    No flares in 20 years   Axillary mass, right 09/05/2018   Diabetes mellitus without complication (HCC)    Hypercholesterolemia    Hypertension    Insomnia    Osteopenia 11/19/2007   per dexa   PMS (premenstrual syndrome)    Psoriasis    Vitamin D deficiency 01/2013    per PCP;he wants [place] to manage    Past Surgical History:  Procedure Laterality Date   BREAST BIOPSY Right 11/20/2017   right breast stereo  fibroadenomatous change   BREAST BIOPSY Right 06/08/2022   Right breast stereo bx, ribbon clip - path pending   CHOLECYSTECTOMY     COLONOSCOPY  12/01/2009   Normal, Michelle Feil, MD MI: 5-year follow-up exam recommended based on family history.   COLPOSCOPY  2010   MASS EXCISION Right 09/10/2018   Procedure: EXCISION AXILLARY MASS;  Surgeon: Earline Mayotte, MD;  Location: [facility] ORS;  Service: General;  Laterality: Right;   TUBAL LIGATION  2001   UPPER GASTROINTESTINAL ENDOSCOPY  2014    Family History  Problem Relation Age of Onset   Prostate cancer Father    Colon cancer Brother 66       gene neg   Arthritis Maternal Grandmother    Stroke Maternal Grandmother    Arthritis Maternal Grandfather        RA   Arthritis Paternal Grandmother    Heart disease Paternal Grandmother 41       CAD - MI - Died   Arthritis Paternal Grandfather    Breast cancer Neg Hx     Social History   Socioeconomic History   Marital status: Married    Spouse  name: [employer]   Number of children: 4   Years of education: 16   Highest education level: Not on file  Occupational History   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 She attended [employer] and obtained her Bachelor's in [employer]. 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 She lives at home with her husband. They  have 4 children Michelle Mckenzie [address] Will [address], [name], [place] 13). She spend time going to soccer games. She enjoys reading but her favorite thing to do is going to the beach.    Social Determinants of Health   Financial Resource Strain: Not on file  Food Insecurity: Not on file  Transportation Needs: Not on file  Physical Activity: Not on file  Stress: Not on file  Social Connections: Not on file  Intimate Partner Violence: Not on file    Current Meds  Medication Sig   ACCU-CHEK GUIDE test strip USE TO CHECK BLOOD SUGARS 3-4X DAILY   Blood Glucose Monitoring Suppl (FIFTY50 GLUCOSE METER 2.0) w/Device KIT Use to test blood sugar.  Please use whatever meter/strips are on formulary.   busPIRone (BUSPAR) 5 MG tablet Take 1 tablet by mouth 2 (two) times daily.   cetirizine (ZYRTEC) 10 MG tablet Take 10 mg by mouth daily.   citalopram (CELEXA) 10 MG tablet TAKE 1 TABLET BY MOUTH EVERY DAY (Patient taking differently: Take 10 mg by mouth daily.)   clobetasol cream (TEMOVATE) 0.05 % 1(ONE) APPLICATION(S) TOPICAL 2(TWO) TIMES A DAY AS NEEDED FOR FLARES   Clobetasol Propionate 0.05 % shampoo WASH SCALP 3(THREE) TIMES A WEEK. LEAVE IT PLACE FOR 30 MINUTES BEFORE RINSING.   Continuous Blood Gluc Sensor (FREESTYLE LIBRE 14 DAY SENSOR) MISC Use 1 each every 14 (fourteen) days E11.65   fluticasone (FLONASE) 50 MCG/ACT nasal spray PLACE 2 SPRAYS INTO BOTH NOSTRILS DAILY. (Patient taking differently: Place 2 sprays into both nostrils daily as needed for allergies.)   JARDIANCE 25 MG TABS tablet Take 1 tablet (25 mg total) by mouth daily.   losartan (COZAAR) 50 MG tablet Take 1 tablet (50 mg total) by mouth daily.   metFORMIN (GLUCOPHAGE-XR) 500 MG 24 hr tablet Take 1 tablet (500 mg total) by mouth daily with breakfast.   Multiple Vitamin (MULTI-VITAMINS) TABS Take 1 tablet by mouth daily.    ONETOUCH DELICA LANCETS 33G MISC USE TO TEST BLOOD SUGAR TWICE A DAY AS DIRECTED   traZODone (DESYREL) 50 MG  tablet TAKE 1 TABLET BY MOUTH EVERY DAY AT BEDTIME AS NEEDED FOR SLEEP   Vitamin D, Ergocalciferol, (DRISDOL) 1.25 MG (50000 UNIT) CAPS capsule Take 50,000 Units by mouth every 14 (fourteen) days.      ROS:  Review of Systems  Constitutional:  Negative for fatigue, fever and unexpected weight change.  Respiratory:  Negative for cough, shortness of breath and wheezing.   Cardiovascular:  Negative for chest pain, palpitations and leg swelling.  Gastrointestinal:  Negative for blood in stool,  constipation, diarrhea, nausea and vomiting.  Endocrine: Negative for cold intolerance, heat intolerance and polyuria.  Genitourinary:  Negative for dyspareunia, dysuria, flank pain, frequency, genital sores, hematuria, menstrual problem, pelvic pain, urgency, vaginal bleeding, vaginal discharge and vaginal pain.  Musculoskeletal:  Negative for back pain, joint swelling and myalgias.  Skin:  Negative for rash.  Neurological:  Negative for dizziness, syncope, light-headedness, numbness and headaches.  Hematological:  Negative for adenopathy.  Psychiatric/Behavioral:  Negative for agitation, confusion, sleep disturbance and suicidal ideas. The patient is not nervous/anxious.      Objective: BP 108/70   Ht 5\' 3"  (1.6 m)   Wt 155 lb (70.3 kg)   BMI 27.46 kg/m    Physical Exam Constitutional:      Appearance: She is well-developed.  Genitourinary:     Vulva normal.     Right Labia: No rash, tenderness or lesions.    Left Labia: No tenderness, lesions or rash.    No vaginal discharge, erythema or tenderness.      Right Adnexa: not tender and no mass present.    Left Adnexa: not tender and no mass present.    No cervical friability or polyp.     Uterus is not enlarged or tender.  Breasts:    Right: No mass, nipple discharge, skin change or tenderness.     Left: No mass, nipple discharge, skin change or tenderness.  Neck:     Thyroid: No thyromegaly.  Cardiovascular:     Rate and Rhythm:  Normal rate and regular rhythm.     Heart sounds: Normal heart sounds. No murmur heard. Pulmonary:     Effort: Pulmonary effort is normal.     Breath sounds: Normal breath sounds.  Abdominal:     Palpations: Abdomen is soft.     Tenderness: There is no abdominal tenderness. There is no guarding or rebound.  Musculoskeletal:        General: Normal range of motion.     Cervical back: Normal range of motion.  Lymphadenopathy:     Cervical: No cervical adenopathy.  Neurological:     General: No focal deficit present.     Mental Status: She is alert and oriented to person, place, and time.     Cranial Nerves: No cranial nerve deficit.  Skin:    General: Skin is warm and dry.  Psychiatric:        Mood and Affect: Mood normal.        Behavior: Behavior normal.        Thought Content: Thought content normal.        Judgment: Judgment normal.  Vitals reviewed.     Assessment/Plan: Encounter for annual routine gynecological examination  Cervical cancer screening - Plan: Cytology - PAP  Screening for HPV (human papillomavirus) - Plan: Cytology - PAP  Encounter for screening mammogram for malignant neoplasm of breast - Plan: MM 3D SCREENING MAMMOGRAM BILATERAL BREAST; pt to schedule mammo   GYN counsel mammography screening, adequate intake of calcium and vitamin D, diet and exercise    F/U  Return in about 1 year (around 03/15/2024).  Mariya Mottley B. Londyn Wotton, PA-C 03/16/2023 12:38 PM

## 2023-03-16 ENCOUNTER — Other Ambulatory Visit (HOSPITAL_COMMUNITY)
Admission: RE | Admit: 2023-03-16 | Discharge: 2023-03-16 | Disposition: A | Discharge status: Home / Self Care | Payer: BC Managed Care – PPO | Source: Ambulatory Visit | Attending: Obstetrics and Gynecology | Admitting: Obstetrics and Gynecology

## 2023-03-16 ENCOUNTER — Ambulatory Visit (INDEPENDENT_AMBULATORY_CARE_PROVIDER_SITE_OTHER): Payer: BC Managed Care – PPO | Admitting: Obstetrics and Gynecology

## 2023-03-16 ENCOUNTER — Encounter: Payer: Self-pay | Admitting: Obstetrics and Gynecology

## 2023-03-16 VITALS — BP 108/70 | Ht 63.0 in | Wt 155.0 lb

## 2023-03-16 DIAGNOSIS — Z1231 Encounter for screening mammogram for malignant neoplasm of breast: Secondary | ICD-10-CM

## 2023-03-16 DIAGNOSIS — Z124 Encounter for screening for malignant neoplasm of cervix: Secondary | ICD-10-CM | POA: Diagnosis not present

## 2023-03-16 DIAGNOSIS — Z01419 Encounter for gynecological examination (general) (routine) without abnormal findings: Secondary | ICD-10-CM | POA: Diagnosis not present

## 2023-03-16 DIAGNOSIS — Z1151 Encounter for screening for human papillomavirus (HPV): Secondary | ICD-10-CM

## 2023-03-16 DIAGNOSIS — Z1211 Encounter for screening for malignant neoplasm of colon: Secondary | ICD-10-CM

## 2023-03-16 NOTE — Patient Instructions (Signed)
I value your feedback and you entrusting us with your care. If you get a [place] patient survey, I would appreciate you taking the time to let us know about your experience today. Thank you!  [facility] ([place])--[phone]  

## 2023-03-20 LAB — CYTOLOGY - PAP
Comment: NEGATIVE
Diagnosis: NEGATIVE
High risk HPV: NEGATIVE

## 2023-05-10 ENCOUNTER — Telehealth: Payer: Self-pay | Admitting: "Endocrinology

## 2023-05-10 NOTE — Telephone Encounter (Signed)
I called pt to see if she did her labs for her appt tomorrow and if she is seeing another Endocrinologist.

## 2023-05-11 ENCOUNTER — Encounter: Payer: Self-pay | Admitting: "Endocrinology

## 2023-05-11 ENCOUNTER — Ambulatory Visit: Payer: BC Managed Care – PPO | Admitting: "Endocrinology

## 2023-05-11 VITALS — BP 116/74 | HR 84 | Ht 63.0 in | Wt 156.2 lb

## 2023-05-11 DIAGNOSIS — I10 Essential (primary) hypertension: Secondary | ICD-10-CM | POA: Diagnosis not present

## 2023-05-11 DIAGNOSIS — E1165 Type 2 diabetes mellitus with hyperglycemia: Secondary | ICD-10-CM | POA: Diagnosis not present

## 2023-05-11 DIAGNOSIS — E782 Mixed hyperlipidemia: Secondary | ICD-10-CM | POA: Diagnosis not present

## 2023-05-11 DIAGNOSIS — Z7984 Long term (current) use of oral hypoglycemic drugs: Secondary | ICD-10-CM | POA: Diagnosis not present

## 2023-05-11 MED ORDER — JARDIANCE 25 MG PO TABS: 25.0000 mg | ORAL_TABLET | Freq: Every day | ORAL | 1 refills | Status: DC

## 2023-05-11 MED ORDER — METFORMIN HCL ER 500 MG PO TB24
1000.0000 mg | ORAL_TABLET | Freq: Every day | ORAL | 1 refills | Status: DC
Start: 2023-05-11 — End: 2023-08-03

## 2023-05-11 MED ORDER — FREESTYLE LIBRE 3 SENSOR MISC: 1.0000 | 2 refills | Status: DC

## 2023-05-11 NOTE — Patient Instructions (Signed)
                                     Advice for Weight Management  -For most of us the best way to lose weight is by diet management. Generally speaking, diet management means consuming less calories intentionally which over time brings about progressive weight loss.  This can be achieved more effectively by avoiding ultra processed carbohydrates, processed meats, unhealthy fats.    It is critically important to know your numbers: how much calorie you are consuming and how much calorie you need. More importantly, our carbohydrates sources should be unprocessed naturally occurring  complex starch food items.  It is always important to balance nutrition also by  appropriate intake of proteins (mainly plant-based), healthy fats/oils, plenty of fruits and vegetables.   -The American College of Lifestyle Medicine (ACL M) recommends nutrition derived mostly from Whole Food, Plant Predominant Sources example an apple instead of applesauce or apple pie. Eat Plenty of vegetables, Mushrooms, fruits, Legumes, Whole Grains, Nuts, seeds in lieu of processed meats, processed snacks/pastries red meat, poultry, eggs.  Use only water or unsweetened tea for hydration.  The College also recommends the need to stay away from risky substances including alcohol, smoking; obtaining 7-9 hours of restorative sleep, at least 150 minutes of moderate intensity exercise weekly, importance of healthy social connections, and being mindful of stress and seek help when it is overwhelming.    -Sticking to a routine mealtime to eat 3 meals a day and avoiding unnecessary snacks is shown to have a big role in weight control. Under normal circumstances, the only time we burn stored energy is when we are hungry, so allow  some hunger to take place- hunger means no food between appropriate meal times, only water.  It is not advisable to starve.   -It is better to avoid simple carbohydrates including:  Cakes, Sweet Desserts, Ice Cream, Soda (diet and regular), Sweet Tea, Candies, Chips, Cookies, Store Bought Juices, Alcohol in Excess of  1-2 drinks a day, Lemonade,  Artificial Sweeteners, Doughnuts, Coffee Creamers, "Sugar-free" Products, etc, etc.  This is not a complete list.....    -Consulting with certified diabetes educators is proven to provide you with the most accurate and current information on diet.  Also, you may be  interested in discussing diet options/exchanges , we can schedule a visit with [facility], RDN, CDE for individualized nutrition education.  -Exercise: If you are able: 30 -60 minutes a day ,4 days a week, or 150 minutes of moderate intensity exercise weekly.    The longer the better if tolerated.  Combine stretch, strength, and aerobic activities.  If you were told in the past that you have high risk for cardiovascular diseases, or if you are currently symptomatic, you may seek evaluation by your heart doctor prior to initiating moderate to intense exercise programs.                                  Additional Care Considerations for Diabetes/Prediabetes   -Diabetes  is a chronic disease.  The most important care consideration is regular follow-up with your diabetes care provider with the goal being avoiding or delaying its complications and to take advantage of advances in medications and technology.  If appropriate actions are taken early enough, type 2 diabetes can even be   reversed.  Seek information from the right source.  - Whole Food, Plant Predominant Nutrition is highly recommended: Eat Plenty of vegetables, Mushrooms, fruits, Legumes, Whole Grains, Nuts, seeds in lieu of processed meats, processed snacks/pastries red meat, poultry, eggs as recommended by American College of  Lifestyle Medicine (ACLM).  -Type 2 diabetes is known to coexist with other important comorbidities such as high blood pressure and high cholesterol.  It is critical to control not only the  diabetes but also the high blood pressure and high cholesterol to minimize and delay the risk of complications including coronary artery disease, stroke, amputations, blindness, etc.  The good news is that this diet recommendation for type 2 diabetes is also very helpful for managing high cholesterol and high blood blood pressure.  - Studies showed that people with diabetes will benefit from a class of medications known as ACE inhibitors and statins.  Unless there are specific reasons not to be on these medications, the standard of care is to consider getting one from these groups of medications at an optimal doses.  These medications are generally considered safe and proven to help protect the heart and the kidneys.    - People with diabetes are encouraged to initiate and maintain regular follow-up with eye doctors, foot doctors, dentists , and if necessary heart and kidney doctors.     - It is highly recommended that people with diabetes quit smoking or stay away from smoking, and get yearly  flu vaccine and pneumonia vaccine at least every 5 years.  See above for additional recommendations on exercise, sleep, stress management , and healthy social connections.      

## 2023-05-11 NOTE — Progress Notes (Signed)
05/11/2023, 2:38 PM   Endocrinology follow-up note  Subjective:    Patient ID: Michelle Mckenzie, female    DOB: 1961/06/22.  Michelle Mckenzie is being seen in follow-up after she was seen in consultation for management of currently uncontrolled symptomatic diabetes requested by  [name], MD.   Past Medical History:  Diagnosis Date   Allergy    Anxiety    Asthma    No flares in 20 years   Axillary mass, right 09/05/2018   Diabetes mellitus without complication (HCC)    Hypercholesterolemia    Hypertension    Insomnia    Osteopenia 11/19/2007   per dexa   PMS (premenstrual syndrome)    Psoriasis    Vitamin D deficiency 01/2013   per PCP;he wants [place] to manage    Past Surgical History:  Procedure Laterality Date   BREAST BIOPSY Right 11/20/2017   right breast stereo  fibroadenomatous change   BREAST BIOPSY Right 06/08/2022   Right breast stereo bx, ribbon clip - path pending   CHOLECYSTECTOMY     COLONOSCOPY  12/01/2009   Normal, [name], MD MI: 5-year follow-up exam recommended based on family history.   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 She attended [employer] and obtained her Bachelor's in [employer]. 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 She lives at home with her husband. They have 4 children [name] [address] [name], [name], [place] 13). She spend time going to soccer games. She enjoys reading but her favorite thing to do is going to the beach.    Social Determinants of Health   Financial Resource Strain: Not on file  Food Insecurity: Not on file  Transportation Needs: Not on file  Physical Activity: Not on file  Stress: Not on file  Social Connections: Not on file    Family History  Problem Relation Age of Onset   Prostate cancer Father    Colon cancer Brother 3       gene neg   Arthritis  Maternal Grandmother    Stroke Maternal Grandmother    Arthritis Maternal Grandfather        RA   Arthritis Paternal Grandmother    Heart disease Paternal Grandmother 14       CAD - MI - Died   Arthritis Paternal Grandfather    Breast cancer Neg Hx     Outpatient Encounter Medications as of 05/11/2023  Medication Sig   Continuous Glucose Sensor (FREESTYLE LIBRE 3 SENSOR) MISC 1 Piece by Does not apply route every 14 (fourteen) days. Place 1 sensor on the skin every 14 days. Use to check glucose continuously   guselkumab (TREMFYA) 100 MG/ML prefilled syringe Inject into the skin. Inject every 8 weeks   ACCU-CHEK GUIDE test strip USE TO CHECK BLOOD SUGARS 3-4X DAILY   Blood Glucose Monitoring Suppl (FIFTY50 GLUCOSE METER 2.0) w/Device KIT Use to test blood sugar.  Please use whatever meter/strips are on formulary.   busPIRone (BUSPAR) 5 MG tablet Take 1 tablet by mouth 2 (two) times daily.   cetirizine (ZYRTEC) 10 MG tablet Take 10 mg by mouth daily.   citalopram (CELEXA) 10 MG tablet TAKE 1 TABLET BY  MOUTH EVERY DAY (Patient taking differently: Take 10 mg by mouth daily.)   clobetasol cream (TEMOVATE) 0.05 % 1(ONE) APPLICATION(S) TOPICAL 2(TWO) TIMES A DAY AS NEEDED FOR FLARES   Clobetasol Propionate 0.05 % shampoo WASH SCALP 3(THREE) TIMES A WEEK. LEAVE IT PLACE FOR 30 MINUTES BEFORE RINSING.   fluticasone (FLONASE) 50 MCG/ACT nasal spray PLACE 2 SPRAYS INTO BOTH NOSTRILS DAILY. (Patient taking differently: Place 2 sprays into both nostrils daily as needed for allergies.)   JARDIANCE 25 MG TABS tablet Take 1 tablet (25 mg total) by mouth daily.   losartan (COZAAR) 50 MG tablet Take 1 tablet (50 mg total) by mouth daily.   lovastatin (MEVACOR) 40 MG tablet Take 40 mg by mouth at bedtime.    metFORMIN (GLUCOPHAGE-XR) 500 MG 24 hr tablet Take 2 tablets (1,000 mg total) by mouth daily with breakfast.   minoxidil (LONITEN) 2.5 MG tablet Take 1.25 mg by mouth 2 (two) times daily. (Patient not taking: Reported on 03/16/2023)   Multiple Vitamin (MULTI-VITAMINS) TABS Take 1 tablet by mouth daily.    ONETOUCH DELICA LANCETS 33G MISC USE TO TEST BLOOD SUGAR TWICE A DAY AS DIRECTED   traZODone (DESYREL) 50 MG tablet TAKE 1 TABLET BY MOUTH EVERY DAY AT BEDTIME AS NEEDED FOR SLEEP   Vitamin D, Ergocalciferol, (DRISDOL) 1.25 MG (50000 UNIT) CAPS capsule Take 50,000 Units by mouth every 14 (fourteen) days.   [DISCONTINUED] Continuous Blood Gluc Sensor (FREESTYLE LIBRE 14 DAY SENSOR) MISC Use 1 each every 14 (fourteen) days E11.65   [DISCONTINUED] JARDIANCE 25 MG TABS tablet Take 1 tablet (25 mg total) by mouth daily.   [DISCONTINUED] metFORMIN (GLUCOPHAGE-XR) 500 MG 24 hr tablet Take 1 tablet (500 mg total) by mouth daily with breakfast.   No facility-administered encounter medications on file as of 05/11/2023.    ALLERGIES: Allergies  Allergen Reactions   Cortisone Rash    Hives, Swelling   Lanolin Rash    VACCINATION STATUS: Immunization History  Administered Date(s) Administered   Influenza Inj  Mdck Quad Pf 06/25/2019   Influenza-Unspecified 07/06/2018   Moderna Sars-Covid-2 Vaccination 09/14/2020   PFIZER(Purple Top)SARS-COV-2 Vaccination 12/18/2019, 01/15/2020    Diabetes She presents for her follow-up diabetic visit. She has type 2 diabetes mellitus. Onset time: She was diagnosed at approximate age of 14 years. Her disease course  has been improving. There are no hypoglycemic associated symptoms. Pertinent negatives for hypoglycemia include no confusion, headaches, pallor or seizures. There are no diabetic associated symptoms. Pertinent negatives for diabetes include no chest pain, no polydipsia, no polyphagia and no polyuria. There are no hypoglycemic complications. Symptoms are improving. There are no diabetic complications. Risk factors for coronary artery disease include dyslipidemia, diabetes mellitus, family history, hypertension and post-menopausal. Her weight is decreasing steadily. Diabetic meal planning: She follows a time restricted eating/intermittent fasting pattern of dieting. Exercise: Her sizes regularly.  She has a plan to have parathyroid and a long walk in [place] sometime in the years. Her home blood glucose trend is decreasing steadily. Her breakfast blood glucose range is generally 140-180 mg/dl. Her lunch blood glucose range is generally 140-180 mg/dl. Her dinner blood glucose range is generally 140-180 mg/dl. Her bedtime blood glucose range is generally 140-180 mg/dl. Her overall blood glucose range is 140-180 mg/dl. (She wears a CGM device showing 81% time in range, 17% level 1 hyperglycemia, 2% level 2 hyperglycemia.  She has no hypoglycemia.    Her most recent average blood glucose is 153 for the last 14 days.  ) An ACE inhibitor/angiotensin II receptor blocker is being taken. Eye exam is current.  Hyperlipidemia This is a chronic problem. The current episode started more than 1 year ago. Exacerbating diseases include diabetes. Pertinent negatives include no chest  pain, myalgias or shortness of breath. Risk factors for coronary artery disease include family history, dyslipidemia, diabetes mellitus, hypertension and post-menopausal.  Hypertension This is a chronic problem. Pertinent negatives include no chest pain, headaches, palpitations or shortness of breath. Risk factors for coronary artery disease include dyslipidemia, diabetes mellitus and post-menopausal state. Past treatments include angiotensin blockers.     Review of Systems  Constitutional:  Negative for chills, fever and unexpected weight change.  HENT:  Negative for trouble swallowing and voice change.   Eyes:  Negative for visual disturbance.  Respiratory:  Negative for cough, shortness of breath and wheezing.   Cardiovascular:  Negative for chest pain, palpitations and leg swelling.  Gastrointestinal:  Negative for diarrhea, nausea and vomiting.  Endocrine: Negative for cold intolerance, heat intolerance, polydipsia, polyphagia and polyuria.  Musculoskeletal:  Negative for arthralgias and myalgias.  Skin:  Negative for color change, pallor, rash and wound.  Neurological:  Negative for seizures and headaches.  Psychiatric/Behavioral:  Negative for confusion and suicidal ideas.     Objective:       05/11/2023    2:00 PM 03/16/2023   10:14 AM 02/08/2023    3:25 PM  Vitals with BMI  Height 5\' 3"  5\' 3"  5\' 3"   Weight 156 lbs 3 oz 155 lbs 164 lbs 6 oz  BMI 27.68 27.46 29.13  Systolic 116 108 96  Diastolic 74 70 78  Pulse 84  80    BP 116/74   Pulse 84   Ht 5\' 3"  (1.6 m)   Wt 156 lb 3.2 oz (70.9 kg)   BMI 27.67 kg/m   Wt Readings from Last 3 Encounters:  05/11/23 156 lb 3.2 oz (70.9 kg)  03/16/23 155 lb (70.3 kg)  02/08/23 164 lb 6.4 oz (74.6 kg)      CMP ( most recent) CMP   Glucose 70 - 110 mg/dL 161 High    Sodium 096 - 145 mmol/L 138   Potassium 3.6 - 5.1 mmol/L 4.0   Chloride 97 - 109 mmol/L 104   Carbon Dioxide (CO2) 22.0 - 32.0 mmol/L  25.9   Urea Nitrogen  (BUN) 7 - 25 mg/dL 16   Creatinine 0.6 - 1.1 mg/dL 0.6   Glomerular Filtration Rate (eGFR) >60 mL/min/1.73sq m 103    Component Ref Range & Units 2 mo ago  Cholesterol, Total 100 - 200 mg/dL 865  Triglyceride 35 - 199 mg/dL 784 High   HDL (High Density Lipoprotein) Cholesterol 35.0 - 85.0 mg/dL 69.6  LDL Calculated 0 - 130 mg/dL 66  VLDL Cholesterol mg/dL 48  Cholesterol/HDL Ratio 3.2    Diabetic Labs (most recent): Lab Results  Component Value Date   HGBA1C 7.4 (A) 02/08/2023   HGBA1C 7 10/12/2022   HGBA1C 6.5 10/20/2016    Assessment & Plan:   1. Type 2 diabetes mellitus with hyperglycemia, without long-term current use of insulin (HCC)  - Michelle Mckenzie has currently uncontrolled symptomatic type 2 DM since  62 years of age.   She wears a CGM device showing 81% time in range, 17% level 1 hyperglycemia, 2% level 2 hyperglycemia.  She has no hypoglycemia.    Her most recent average blood glucose is 153 for the last 14 days.   Recent labs reviewed. - I had a long discussion with her about the possible risk factors and  the pathology behind its diabetes and its complications. -her diabetes is complicated by morbid hyperlipidemia and hypertension  and she remains at a high risk for more acute and chronic complications which include CAD, CVA, CKD, retinopathy, and neuropathy. These are all discussed in detail with her.  - I discussed all available options of managing her diabetes including de-escalation of medications. I have counseled her on Food as Medicine by adopting a Whole Food , Plant Predominant  ( WFPP) nutrition as recommended by [employer] of Lifestyle Medicine. Patient is encouraged to switch to  unprocessed or minimally processed  complex starch, adequate protein intake (mainly plant source), minimal liquid fat, plenty of fruits, and vegetables. -  she is advised to stick to a routine mealtimes to eat 3 complete meals a day and snack only when necessary  ( to snack only to correct hypoglycemia BG <70 day time or <100 at night).   - she acknowledges that there is a room for improvement in her food and drink choices. - Suggestion is made for her to avoid simple carbohydrates  from her diet including Cakes, Sweet Desserts, Ice Cream, Soda (diet and regular), Sweet Tea, Candies, Chips, Cookies, Store Bought Juices, Alcohol , Artificial Sweeteners,  Coffee Creamer, and "Sugar-free" Products, Lemonade. This will help patient to have more stable blood glucose profile and potentially avoid unintended weight gain.  The following Lifestyle Medicine recommendations according to American College of Lifestyle Medicine  [facility]) were discussed and and offered to patient and she  agrees to start the journey:  A. Whole Foods, Plant-Based Nutrition comprising of fruits and vegetables, plant-based proteins, whole-grain carbohydrates was discussed in detail with the patient.   A list for source of those nutrients were also provided to the patient.  Patient will use only water or unsweetened tea for hydration. B.  The need to stay away from risky substances including alcohol, smoking; obtaining 7 to 9 hours of restorative sleep, at least 150 minutes of moderate intensity exercise weekly, the importance of healthy social connections,  and stress management techniques were discussed. C.  A full color page of  Calorie density of various food groups per pound showing examples of each food groups was provided to the patient.   -  she will be scheduled with Norm Salt, RDN, CDE for individualized diabetes education.  - I have approached her with the following individualized plan to manage  her diabetes and patient agrees:   -In light of her presentation with near target glycemic profile, she will not need insulin treatment for now.  She declines an offer for GLP-1 receptor agonist.   -To give her better glycemic control, I advised her to increase her metformin to 1000 mg  p.o. daily at breakfast.  She will continue to benefit from [address], currently at 25 mg p.o. daily at breakfast.    She is advised to continue to wear her CGM continuously, and advised to call back clinic if her weekly average is greater than 150 mg per DL.  -She has several options if she cannot control diabetes to target options including resuming Jardiance.    - Specific targets for  A1c;  LDL, HDL,  and Triglycerides were discussed with the patient.  2) Blood Pressure /Hypertension: -Her blood pressure is controlled to target.  she is advised to continue her current medications including losartan 50 mg p.o. daily, whole food plant-based diet discussed above will also help her control hypertension without further escalation of medications.     3) Lipids/Hyperlipidemia: Her recent lipid panel showed controlled LDL at 66.    She is advised to continue lovastatin 40 mg p.o. nightly.     4)  Weight/Diet:  Body mass index is 27.67 kg/m.  -   she is  a candidate for  some weight loss. I discussed with her the fact that loss of 5 - 10% of her  current body weight will have the most impact on her diabetes management.  The above detailed  ACLM recommendations for nutrition, exercise, sleep, social life, avoidance of risky substances, the need for restorative sleep   information will also detailed on discharge instructions.  5) Chronic Care/Health Maintenance:  -she  is on ACEI/ARB and Statin medications and  is encouraged to initiate and continue to follow up with Ophthalmology, Dentist,  Podiatrist at least yearly or according to recommendations, and advised to   stay away from smoking. I have recommended yearly flu vaccine and pneumonia vaccine at least every 5 years; moderate intensity exercise for up to 150 minutes weekly; and  sleep for 7- 9 hours a day.  - she is  advised to maintain close follow up with [name], MD for primary care needs, as well as her other providers for optimal  and coordinated care.    I spent  26  minutes in the care of the patient today including review of labs from CMP, Lipids, Thyroid Function, Hematology (current and previous including abstractions from other facilities); face-to-face time discussing  her blood glucose readings/logs, discussing hypoglycemia and hyperglycemia episodes and symptoms, medications doses, her options of short and long term treatment based on the latest standards of care / guidelines;  discussion about incorporating lifestyle medicine;  and documenting the encounter. Risk reduction counseling performed per USPSTF guidelines to reduce  obesity and cardiovascular risk factors.     Please refer to Patient Instructions for Blood Glucose Monitoring and Insulin/Medications Dosing Guide"  in media tab for additional information. Please  also refer to " Patient Self Inventory" in the Media  tab for reviewed elements of pertinent patient history.  Jerl Mina participated in the discussions, expressed understanding, and voiced agreement with the above plans.  All questions were answered to her satisfaction. she is encouraged  to contact clinic should she have any questions or concerns prior to her return visit.    Follow up plan: - Return in about 3 months (around 08/11/2023) for Bring Meter/CGM Device/Logs- A1c in Office.  Marquis Lunch, MD [facility] [address] [place], [place] [address] Phone: [phone]  Fax: [phone]    05/11/2023, 2:38 PM  This note was partially dictated with voice recognition software. Similar sounding words can be transcribed inadequately or may not  be corrected upon review.

## 2023-05-17 ENCOUNTER — Encounter: Payer: Self-pay | Admitting: Family Medicine

## 2023-05-17 ENCOUNTER — Ambulatory Visit: Payer: BC Managed Care – PPO | Admitting: Family Medicine

## 2023-05-17 ENCOUNTER — Other Ambulatory Visit: Payer: Self-pay

## 2023-05-17 VITALS — BP 106/64 | Ht 63.0 in | Wt 156.0 lb

## 2023-05-17 DIAGNOSIS — M84374A Stress fracture, right foot, initial encounter for fracture: Secondary | ICD-10-CM | POA: Insufficient documentation

## 2023-05-17 DIAGNOSIS — M79671 Pain in right foot: Secondary | ICD-10-CM | POA: Diagnosis not present

## 2023-05-17 NOTE — Progress Notes (Addendum)
Michelle Mckenzie - 62 y.o. female MRN [id number]  Date of birth: 02/09/1961  PCP: [name], MD  Subjective:  No chief complaint on file. Right foot pain  HPI: Past Medical, Surgical, Social, and Family History Reviewed & Updated per EMR.   Patient is a 62 y.o. female here for severity: Moderate, timing: constant, unchanged, localized, pain located at the distal mid right foot that started in April when she was training for a 600 mile walk in [place] this September.  She was initially evaluated for a stress fracture in the proximal foot, wore a cam walker and transition out of it.  She returned to training and experienced new pain in the distal portion.  She had x-rays performed that showed no acute fracture and stress fracture was suspected. It is exacerbated by walking and weightbearing and alleviated by rest.  She has not had any numbness or weakness in the foot, no edema, warmth, fever, chills.  Past Medical History:  Diagnosis Date   Allergy    Anxiety    Asthma    No flares in 20 years   Axillary mass, right 09/05/2018   Diabetes mellitus without complication (HCC)    Hypercholesterolemia    Hypertension    Insomnia    Osteopenia 11/19/2007   per dexa   PMS (premenstrual syndrome)    Psoriasis    Vitamin D deficiency 01/2013   per PCP;he wants [place] to manage    Current Outpatient Medications on File Prior to Visit  Medication Sig Dispense Refill   ACCU-CHEK GUIDE test strip USE TO CHECK BLOOD SUGARS 3-4X DAILY     Blood Glucose Monitoring Suppl (FIFTY50 GLUCOSE METER 2.0) w/Device KIT Use to test blood sugar.  Please use whatever meter/strips are on formulary.     busPIRone (BUSPAR) 5 MG tablet Take 1 tablet by mouth 2 (two) times daily.     cetirizine (ZYRTEC) 10 MG tablet Take 10 mg by mouth daily.     citalopram (CELEXA) 10 MG tablet TAKE 1 TABLET BY MOUTH EVERY DAY (Patient taking differently: Take 10 mg by mouth daily.) 90 tablet 1   clobetasol cream (TEMOVATE)  0.05 % 1(ONE) APPLICATION(S) TOPICAL 2(TWO) TIMES A DAY AS NEEDED FOR FLARES     Clobetasol Propionate 0.05 % shampoo WASH SCALP 3(THREE) TIMES A WEEK. LEAVE IT PLACE FOR 30 MINUTES BEFORE RINSING.     Continuous Glucose Sensor (FREESTYLE LIBRE 3 SENSOR) MISC 1 Piece by Does not apply route every 14 (fourteen) days. Place 1 sensor on the skin every 14 days. Use to check glucose continuously 6 each 2   fluticasone (FLONASE) 50 MCG/ACT nasal spray PLACE 2 SPRAYS INTO BOTH NOSTRILS DAILY. (Patient taking differently: Place 2 sprays into both nostrils daily as needed for allergies.) 16 g 11   guselkumab (TREMFYA) 100 MG/ML prefilled syringe Inject into the skin. Inject every 8 weeks     JARDIANCE 25 MG TABS tablet Take 1 tablet (25 mg total) by mouth daily. 90 tablet 1   losartan (COZAAR) 50 MG tablet Take 1 tablet (50 mg total) by mouth daily. 90 tablet 3   lovastatin (MEVACOR) 40 MG tablet Take 40 mg by mouth at bedtime.      metFORMIN (GLUCOPHAGE-XR) 500 MG 24 hr tablet Take 2 tablets (1,000 mg total) by mouth daily with breakfast. 180 tablet 1   minoxidil (LONITEN) 2.5 MG tablet Take 1.25 mg by mouth 2 (two) times daily. (Patient not taking: Reported on 03/16/2023)  Multiple Vitamin (MULTI-VITAMINS) TABS Take 1 tablet by mouth daily.      ONETOUCH DELICA LANCETS 33G MISC USE TO TEST BLOOD SUGAR TWICE A DAY AS DIRECTED     traZODone (DESYREL) 50 MG tablet TAKE 1 TABLET BY MOUTH EVERY DAY AT BEDTIME AS NEEDED FOR SLEEP     Vitamin D, Ergocalciferol, (DRISDOL) 1.25 MG (50000 UNIT) CAPS capsule Take 50,000 Units by mouth every 14 (fourteen) days.     No current facility-administered medications on file prior to visit.    Past Surgical History:  Procedure Laterality Date   BREAST BIOPSY Right 11/20/2017   right breast stereo  fibroadenomatous change   BREAST BIOPSY Right 06/08/2022   Right breast stereo bx, ribbon clip - path pending   CHOLECYSTECTOMY     COLONOSCOPY  12/01/2009   Normal, [name], MD MI: 5-year follow-up exam recommended based on family history.   COLPOSCOPY  2010   MASS EXCISION Right 09/10/2018   Procedure: EXCISION AXILLARY MASS;  Surgeon: Earline Mayotte, MD;  Location: [facility] ORS;  Service: General;  Laterality: Right;   TUBAL LIGATION  2001   UPPER GASTROINTESTINAL ENDOSCOPY  2014    Allergies  Allergen Reactions   Cortisone Rash    Hives, Swelling   Lanolin Rash        Objective:  Physical Exam: VS: BP:106/64  HR: bpm  TEMP: ( )  RESP:   HT:5\' 3"  (160 cm)   WT:156 lb (70.8 kg)  BMI:27.64  Gen: NAD, speaks clearly, comfortable in exam room Respiratory: normal work of breathing on room air Skin: No rashes, abrasions, or ecchymosis MSK: Normal muscle tone, no atrophy Right foot:  Observation: There is no obvious deformity, edema, ecchymosis of the right foot.  She does have some pronation bilaterally when standing. ROM of passive IR and ER of forefoot: Internal rotation produces pain distally in the midfoot ROM Phalanges: Full without pain Long arch: preserved Transverse arch: Some breakdown of the arch compared to the left Callus pattern: None Talar dome nontender, Navicular NT, Cuboid: NT ATFL: NT, CFL: NT Deltoid: NT Calcaneous: ttp: non TTP  Peroneals: NT Post Tib: NT Metatarsals: ttp: TTP over MT 2 distally, no brachymetatarsia present 5th MT: Nontender 1st TMT: NT, no deformity, edema or erythema Phalanges: NT Great Toe: ROM normal Other foot breakdown: none Hindfoot breakdown: none Dorsalis Pedis pulse: 2+, and equal bilat Sensation: intact  Ultrasound:  Limited ultrasound of the right foot:  - 1-4th TMT: No hypoechoic changes - : there is a hypoechoic line in the dorsal aspect of the distal MT head visualized in short and long axis, no frank hypoechoic changes superficial to it but some hypoechogenicity of the soft tissue.  Adjacent MTs with no hypoechoic changes in the ostium.   Summary: Ultrasound findings  consistent with simple, nondisplaced distal stress fracture of the second metatarsal head  Ultrasound and interpretation by Dr. Webb Silversmith  Assessment & Plan:   Stress fracture of metatarsal bone of right foot - We will have Monai continue in her cam walker boot for another 2 weeks.  We discussed icing at night and anti-inflammatories as needed.  She can remove the boot to drive but will take it with her and wear at location. - We will follow-up with her in 2 weeks for repeat ultrasound.  If she is healing well at that point we can fit her with a temporary orthotic.  She will likely benefit from scaphoid pad and metatarsal pad as she  has pronation bilaterally and breakdown of her transverse arch on the right    Rica Mote MD [facility] Sports Medicine Fellow  Addendum:  Patient seen in the office by fellow.  His history, exam, plan of care were precepted with me.  Norton Blizzard MD Marrianne Mood

## 2023-05-17 NOTE — Assessment & Plan Note (Signed)
-   We will have Michelle Mckenzie continue in her cam walker boot for another 2 weeks.  We discussed icing at night and anti-inflammatories as needed.  She can remove the boot to drive but will take it with her and wear at location. - We will follow-up with her in 2 weeks for reultrasounding.  If she is healing well at that point we can fit her with a temporary orthotic.  She will likely benefit from scaphoid pad and metatarsal pad as she has pronation bilaterally and breakdown of her transverse arch on the right

## 2023-05-17 NOTE — Patient Instructions (Signed)
Continue with CAM walker boot for 2 weeks Ice at night Follow up in 2 weeks for repeat ultrasound and orthotic

## 2023-06-01 ENCOUNTER — Ambulatory Visit: Payer: BC Managed Care – PPO | Admitting: Family Medicine

## 2023-06-01 ENCOUNTER — Encounter: Payer: Self-pay | Admitting: Family Medicine

## 2023-06-01 ENCOUNTER — Other Ambulatory Visit: Payer: Self-pay

## 2023-06-01 VITALS — BP 133/79 | Ht 63.0 in | Wt 155.0 lb

## 2023-06-01 DIAGNOSIS — M79671 Pain in right foot: Secondary | ICD-10-CM

## 2023-06-01 NOTE — Progress Notes (Signed)
PCP: [name], MD  Subjective:   HPI: Patient is a 62 y.o. female here for right foot pain.  8/21: Patient is a 62 y.o. female here for severity: Moderate, timing: constant, unchanged, localized, pain located at the distal mid right foot that started in April when she was training for a 600 mile walk in [place] this September.  She was initially evaluated for a stress fracture in the proximal foot, wore a cam walker and transition out of it.  She returned to training and experienced new pain in the distal portion.  She had x-rays performed that showed no acute fracture and stress fracture was suspected. It is exacerbated by walking and weightbearing and alleviated by rest.  She has not had any numbness or weakness in the foot, no edema, warmth, fever, chills.  9/5: Patient reports she is improved since last visit. Wearing boot when up and walking around. Leaving for her trip to [place] in just over a week. Not needing anything for pain.  Past Medical History:  Diagnosis Date   Allergy    Anxiety    Asthma    No flares in 20 years   Axillary mass, right 09/05/2018   Diabetes mellitus without complication (HCC)    Hypercholesterolemia    Hypertension    Insomnia    Osteopenia 11/19/2007   per dexa   PMS (premenstrual syndrome)    Psoriasis    Vitamin D deficiency 01/2013   per PCP;he wants [place] to manage    Current Outpatient Medications on File Prior to Visit  Medication Sig Dispense Refill   ACCU-CHEK GUIDE test strip USE TO CHECK BLOOD SUGARS 3-4X DAILY     Blood Glucose Monitoring Suppl (FIFTY50 GLUCOSE METER 2.0) w/Device KIT Use to test blood sugar.  Please use whatever meter/strips are on formulary.     busPIRone (BUSPAR) 5 MG tablet Take 1 tablet by mouth 2 (two) times daily.     cetirizine (ZYRTEC) 10 MG tablet Take 10 mg by mouth daily.     citalopram (CELEXA) 10 MG tablet TAKE 1 TABLET BY MOUTH EVERY DAY (Patient taking differently: Take 10 mg by mouth daily.) 90  tablet 1   clobetasol cream (TEMOVATE) 0.05 % 1(ONE) APPLICATION(S) TOPICAL 2(TWO) TIMES A DAY AS NEEDED FOR FLARES     Clobetasol Propionate 0.05 % shampoo WASH SCALP 3(THREE) TIMES A WEEK. LEAVE IT PLACE FOR 30 MINUTES BEFORE RINSING.     Continuous Glucose Sensor (FREESTYLE LIBRE 3 SENSOR) MISC 1 Piece by Does not apply route every 14 (fourteen) days. Place 1 sensor on the skin every 14 days. Use to check glucose continuously 6 each 2   fluticasone (FLONASE) 50 MCG/ACT nasal spray PLACE 2 SPRAYS INTO BOTH NOSTRILS DAILY. (Patient taking differently: Place 2 sprays into both nostrils daily as needed for allergies.) 16 g 11   guselkumab (TREMFYA) 100 MG/ML prefilled syringe Inject into the skin. Inject every 8 weeks     JARDIANCE 25 MG TABS tablet Take 1 tablet (25 mg total) by mouth daily. 90 tablet 1   losartan (COZAAR) 50 MG tablet Take 1 tablet (50 mg total) by mouth daily. 90 tablet 3   lovastatin (MEVACOR) 40 MG tablet Take 40 mg by mouth at bedtime.      metFORMIN (GLUCOPHAGE-XR) 500 MG 24 hr tablet Take 2 tablets (1,000 mg total) by mouth daily with breakfast. 180 tablet 1   minoxidil (LONITEN) 2.5 MG tablet Take 1.25 mg by mouth 2 (two) times daily. (Patient  not taking: Reported on 03/16/2023)     Multiple Vitamin (MULTI-VITAMINS) TABS Take 1 tablet by mouth daily.      ONETOUCH DELICA LANCETS 33G MISC USE TO TEST BLOOD SUGAR TWICE A DAY AS DIRECTED     traZODone (DESYREL) 50 MG tablet TAKE 1 TABLET BY MOUTH EVERY DAY AT BEDTIME AS NEEDED FOR SLEEP     Vitamin D, Ergocalciferol, (DRISDOL) 1.25 MG (50000 UNIT) CAPS capsule Take 50,000 Units by mouth every 14 (fourteen) days.     No current facility-administered medications on file prior to visit.    Past Surgical History:  Procedure Laterality Date   BREAST BIOPSY Right 11/20/2017   right breast stereo  fibroadenomatous change   BREAST BIOPSY Right 06/08/2022   Right breast stereo bx, ribbon clip - path pending   CHOLECYSTECTOMY      COLONOSCOPY  12/01/2009   Normal, [name], MD MI: 5-year follow-up exam recommended based on family history.   COLPOSCOPY  2010   MASS EXCISION Right 09/10/2018   Procedure: EXCISION AXILLARY MASS;  Surgeon: Earline Mayotte, MD;  Location: [facility] ORS;  Service: General;  Laterality: Right;   TUBAL LIGATION  2001   UPPER GASTROINTESTINAL ENDOSCOPY  2014    Allergies  Allergen Reactions   Cortisone Rash    Hives, Swelling   Lanolin Rash    BP 133/79   Ht 5\' 3"  (1.6 m)   Wt 155 lb (70.3 kg)   BMI 27.46 kg/m       No data to display              No data to display              Objective:  Physical Exam:  Gen: NAD, comfortable in exam room  Right foot/ankle: No gross deformity, swelling, ecchymoses FROM without pain No TTP Negative metatarsal squeeze. NV intact distally.   Limited MSK u/s right foot:  No cortical irregularity of 2nd metatarsal on long axis; on short axis a double line seen consistent with healing distal 2nd metatarsal fracture.  No edema.  Assessment & Plan:  1. Right foot pain - excellent healing of 2nd metatarsal stress fracture.  Switch to sports insoles with scaphoid pads.  Consider addition of metatarsal pads in future if not improving.  Icing, tylenol if needed.  F/u when she returns from her trip or prn.

## 2023-06-01 NOTE — Patient Instructions (Addendum)
You're doing great! Wear sports insoles in good tennis shoes when up and walking around for the next 4 weeks. You can switch these in and out of different shoes. Keep the boot with you the next week just in case you're going to be walking a very long distance or if pain worsens (not expected). Follow up with [place] in 4 weeks. Have a safe and fun trip!

## 2023-08-03 ENCOUNTER — Other Ambulatory Visit: Payer: Self-pay | Admitting: "Endocrinology

## 2023-09-05 ENCOUNTER — Ambulatory Visit: Payer: BC Managed Care – PPO | Admitting: "Endocrinology

## 2023-09-26 ENCOUNTER — Other Ambulatory Visit: Payer: Self-pay

## 2023-09-26 DIAGNOSIS — E1165 Type 2 diabetes mellitus with hyperglycemia: Secondary | ICD-10-CM

## 2023-09-26 MED ORDER — METFORMIN HCL ER 500 MG PO TB24: 1000.0000 mg | ORAL_TABLET | Freq: Every day | ORAL | 0 refills | Status: DC

## 2023-09-26 MED ORDER — JARDIANCE 25 MG PO TABS: 25.0000 mg | ORAL_TABLET | Freq: Every day | ORAL | 0 refills | Status: DC

## 2023-09-28 ENCOUNTER — Other Ambulatory Visit: Payer: Self-pay

## 2023-09-28 DIAGNOSIS — E1165 Type 2 diabetes mellitus with hyperglycemia: Secondary | ICD-10-CM

## 2023-09-28 MED ORDER — METFORMIN HCL ER 500 MG PO TB24: 1000.0000 mg | ORAL_TABLET | Freq: Every day | ORAL | 0 refills | Status: DC

## 2023-10-15 ENCOUNTER — Other Ambulatory Visit: Payer: Self-pay | Admitting: "Endocrinology

## 2023-10-15 DIAGNOSIS — E1165 Type 2 diabetes mellitus with hyperglycemia: Secondary | ICD-10-CM

## 2023-10-23 ENCOUNTER — Ambulatory Visit: Payer: BC Managed Care – PPO | Admitting: "Endocrinology

## 2023-10-25 ENCOUNTER — Encounter: Payer: Self-pay | Admitting: "Endocrinology

## 2023-10-25 ENCOUNTER — Ambulatory Visit: Payer: BC Managed Care – PPO | Admitting: "Endocrinology

## 2023-10-25 VITALS — BP 102/72 | HR 84 | Ht 63.0 in | Wt 154.4 lb

## 2023-10-25 DIAGNOSIS — Z7984 Long term (current) use of oral hypoglycemic drugs: Secondary | ICD-10-CM | POA: Diagnosis not present

## 2023-10-25 DIAGNOSIS — I10 Essential (primary) hypertension: Secondary | ICD-10-CM | POA: Diagnosis not present

## 2023-10-25 DIAGNOSIS — E1165 Type 2 diabetes mellitus with hyperglycemia: Secondary | ICD-10-CM

## 2023-10-25 DIAGNOSIS — E782 Mixed hyperlipidemia: Secondary | ICD-10-CM

## 2023-10-25 LAB — POCT GLYCOSYLATED HEMOGLOBIN (HGB A1C)

## 2023-10-25 MED ORDER — METFORMIN HCL ER 500 MG PO TB24: 1000.0000 mg | ORAL_TABLET | Freq: Every day | ORAL | 1 refills | Status: DC

## 2023-10-25 NOTE — Progress Notes (Signed)
10/25/2023, 1:41 PM   Endocrinology follow-up note  Subjective:    Patient ID: Michelle Mckenzie, female    DOB: 09-Aug-1961.  Michelle Mckenzie is being seen in follow-up after she was seen in consultation for management of currently uncontrolled symptomatic diabetes requested by  [name], MD.   Past Medical History:  Diagnosis Date   Allergy    Anxiety    Asthma    No flares in 20 years   Axillary mass, right 09/05/2018   Diabetes mellitus without complication (HCC)    Hypercholesterolemia    Hypertension    Insomnia    Osteopenia 11/19/2007   per dexa   PMS (premenstrual syndrome)    Psoriasis    Vitamin D deficiency 01/2013   per PCP;he wants [place] to manage    Past Surgical History:  Procedure Laterality Date   BREAST BIOPSY Right 11/20/2017   right breast stereo  fibroadenomatous change   BREAST BIOPSY Right 06/08/2022   Right breast stereo bx, ribbon clip - path pending   CHOLECYSTECTOMY     COLONOSCOPY  12/01/2009   Normal, [name], MD MI: 5-year follow-up exam recommended based on family history.   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 She attended [employer] and obtained her Bachelor's in [employer]. 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 She lives at home with her husband. They have 4 children [name] [address] [name], [name], [place] 13). She spend time going to soccer games. She enjoys reading but her favorite thing to do is going to the beach.    Social Drivers of Corporate investment banker Strain: Low Risk  (10/03/2023)   [facility]   Overall Financial Resource Strain (CARDIA)    Difficulty of Paying Living Expenses: Not hard at all  Food Insecurity: No Food Insecurity (10/03/2023)   [facility] from [facility]   Hunger Vital Sign    Worried About Running Out of Food in the Last Year: Never true  Ran Out of Food in the Last Year: Never true  Transportation Needs: No Transportation Needs (10/03/2023)   Received from [facility] - Transportation    In the past 12 months, has lack of transportation kept you from medical appointments or from getting medications?: No    Lack of Transportation (Non-Medical): No  Physical Activity: Not on file  Stress: Not on file  Social Connections: Not on file    Family History  Problem Relation Age of Onset   Prostate cancer Father    Colon cancer Brother 43       gene neg   Arthritis Maternal Grandmother    Stroke Maternal Grandmother    Arthritis Maternal Grandfather        RA   Arthritis Paternal Grandmother    Heart disease Paternal Grandmother 11       CAD - MI - Died   Arthritis Paternal Grandfather    Breast cancer Neg Hx     Outpatient Encounter Medications as of 10/25/2023  Medication Sig   minoxidil (LONITEN) 2.5 MG tablet Take 1.25 mg by mouth 2 (two) times daily.    spironolactone (ALDACTONE) 100 MG tablet Take 100 mg by mouth daily.   ACCU-CHEK GUIDE test strip USE TO CHECK BLOOD SUGARS 3-4X DAILY   Blood Glucose Monitoring Suppl (FIFTY50 GLUCOSE METER 2.0) w/Device KIT Use to test blood sugar.  Please use whatever meter/strips are on formulary.   busPIRone (BUSPAR) 5 MG tablet Take 1 tablet by mouth 2 (two) times daily.   cetirizine (ZYRTEC) 10 MG tablet Take 10 mg by mouth daily.   clobetasol cream (TEMOVATE) 0.05 % 1(ONE) APPLICATION(S) TOPICAL 2(TWO) TIMES A DAY AS NEEDED FOR FLARES   Clobetasol Propionate 0.05 % shampoo WASH SCALP 3(THREE) TIMES A WEEK. LEAVE IT PLACE FOR 30 MINUTES BEFORE RINSING.   Continuous Glucose Sensor (FREESTYLE LIBRE 3 SENSOR) MISC 1 Piece by Does not apply route every 14 (fourteen) days. Place 1 sensor on the skin every 14 days. Use to check glucose continuously   fluticasone (FLONASE) 50 MCG/ACT nasal spray PLACE 2 SPRAYS INTO BOTH NOSTRILS DAILY. (Patient taking differently: Place 2 sprays into both nostrils daily as needed for allergies.)   guselkumab (TREMFYA) 100 MG/ML prefilled syringe Inject into the skin. Inject every 8 weeks   JARDIANCE 25 MG TABS tablet Take 1 tablet (25 mg total) by mouth daily.   losartan (COZAAR) 50 MG tablet Take 1 tablet (50 mg total) by mouth daily.   lovastatin (MEVACOR) 40 MG tablet Take 40 mg by mouth at bedtime.    metFORMIN (GLUCOPHAGE-XR) 500 MG 24 hr tablet TAKE 2 TABLETS BY MOUTH EVERY DAY WITH BREAKFAST (Patient taking differently: 500 mg daily with breakfast.)   Multiple Vitamin (MULTI-VITAMINS) TABS Take 1 tablet by mouth daily.    ONETOUCH DELICA LANCETS 33G MISC USE TO TEST BLOOD SUGAR TWICE A DAY AS DIRECTED   traZODone (DESYREL) 50 MG tablet TAKE 1 TABLET BY MOUTH EVERY DAY AT BEDTIME AS NEEDED FOR SLEEP   Vitamin D, Ergocalciferol, (DRISDOL) 1.25 MG (50000 UNIT) CAPS capsule Take 50,000 Units by mouth every 14 (fourteen) days.   [DISCONTINUED] citalopram (CELEXA) 10 MG tablet  TAKE 1 TABLET BY MOUTH EVERY DAY (Patient taking differently: Take 10 mg by mouth daily.)   No facility-administered encounter medications on file as of 10/25/2023.    ALLERGIES: Allergies  Allergen Reactions   Cortisone Rash    Hives, Swelling   Lanolin Rash  VACCINATION STATUS: Immunization History  Administered Date(s) Administered   Influenza Inj Mdck Quad Pf 06/25/2019   Influenza-Unspecified 07/06/2018   Moderna Sars-Covid-2 Vaccination 09/14/2020   PFIZER(Purple Top)SARS-COV-2 Vaccination 12/18/2019, 01/15/2020    Diabetes She presents for her follow-up diabetic visit. She has type 2 diabetes mellitus. Onset time: She was diagnosed at approximate age of 76 years. Her disease course has been worsening. There are no hypoglycemic associated symptoms. Pertinent negatives for hypoglycemia include no confusion, headaches, pallor or seizures. There are no diabetic associated symptoms. Pertinent negatives for diabetes include no chest pain, no polydipsia, no polyphagia and no polyuria. There are no hypoglycemic complications. Symptoms are worsening. There are no diabetic complications. Risk factors for coronary artery disease include dyslipidemia, diabetes mellitus, family history, hypertension and post-menopausal. Her weight is decreasing steadily. She is following a generally unhealthy diet. Exercise: Her sizes regularly.  She has a plan to have parathyroid and a long walk in [place] sometime in the years. Her home blood glucose trend is increasing steadily. Her breakfast blood glucose range is generally 180-200 mg/dl. Her lunch blood glucose range is generally 180-200 mg/dl. Her dinner blood glucose range is generally 180-200 mg/dl. Her bedtime blood glucose range is generally 180-200 mg/dl. Her overall blood glucose range is 180-200 mg/dl. (She wears a CGM device showing worsening stomach profile: 40% time range, 49% Level One hypoglycemia, 11% level 2-Per glycemia.  Her point-of-care A1c  9.5%.  Her average blood glucose is 197 for the last 14 days.    ) An ACE inhibitor/angiotensin II receptor blocker is being taken. Eye exam is current.  Hyperlipidemia This is a chronic problem. The current episode started more than 1 year ago. Exacerbating diseases include diabetes. Pertinent negatives include no chest pain, myalgias or shortness of breath. Risk factors for coronary artery disease include family history, dyslipidemia, diabetes mellitus, hypertension and post-menopausal.  Hypertension This is a chronic problem. Pertinent negatives include no chest pain, headaches, palpitations or shortness of breath. Risk factors for coronary artery disease include dyslipidemia, diabetes mellitus and post-menopausal state. Past treatments include angiotensin blockers.     Review of Systems  Constitutional:  Negative for chills, fever and unexpected weight change.  HENT:  Negative for trouble swallowing and voice change.   Eyes:  Negative for visual disturbance.  Respiratory:  Negative for cough, shortness of breath and wheezing.   Cardiovascular:  Negative for chest pain, palpitations and leg swelling.  Gastrointestinal:  Negative for diarrhea, nausea and vomiting.  Endocrine: Negative for cold intolerance, heat intolerance, polydipsia, polyphagia and polyuria.  Musculoskeletal:  Negative for arthralgias and myalgias.  Skin:  Negative for color change, pallor, rash and wound.  Neurological:  Negative for seizures and headaches.  Psychiatric/Behavioral:  Negative for confusion and suicidal ideas.     Objective:       10/25/2023    1:33 PM 06/01/2023    8:44 AM 05/17/2023    8:55 AM  Vitals with BMI  Height 5\' 3"  5\' 3"  5\' 3"   Weight 154 lbs 6 oz 155 lbs 156 lbs  BMI 27.36 27.46 27.64  Systolic 102 133 161  Diastolic 72 79 64  Pulse 84      BP 102/72   Pulse 84   Ht 5\' 3"  (1.6 m)   Wt 154 lb 6.4 oz (70 kg)   BMI 27.35 kg/m   Wt Readings from Last 3 Encounters:  10/25/23 154  lb 6.4 oz (70 kg)  06/01/23 155 lb (70.3 kg)  05/17/23 156  lb (70.8 kg)    Glucose 70 - 110 mg/dL 098 High    Sodium 119 - 145 mmol/L 135 Low    Potassium 3.6 - 5.1 mmol/L 4   Chloride 97 - 109 mmol/L 99   Carbon Dioxide (CO2) 22.0 - 32.0 mmol/L 27.6   Urea Nitrogen (BUN) 7 - 25 mg/dL 13   Creatinine 0.6 - 1.1 mg/dL 0.5 Low    Glomerular Filtration Rate (eGFR) >60 mL/min/1.73sq m 107     Component Ref Range & Units 3 wk ago  Cholesterol, Total 100 - 200 mg/dL 147 High   Triglyceride 35 - 199 mg/dL 829 High   HDL (High Density Lipoprotein) Cholesterol 35.0 - 85.0 mg/dL 56.2  LDL Calculated 0 - 130 mg/dL 130 High   VLDL Cholesterol mg/dL 43  Cholesterol/HDL Ratio 4.3    Diabetic Labs (most recent): Lab Results  Component Value Date   HGBA1C 7.4 (A) 02/08/2023   HGBA1C 7 10/12/2022   HGBA1C 6.5 10/20/2016    Assessment & Plan:   1. Type 2 diabetes mellitus with hyperglycemia, without long-term current use of insulin (HCC)  - LIESE DIZDAREVIC has currently uncontrolled symptomatic type 2 DM since  63 years of age.   She wears a CGM device showing worsening stomach profile: 40% time range, 49% Level One hypoglycemia, 11% level 2-Per glycemia.  Her point-of-care A1c 9.5%.  Her average blood glucose is 197 for the last 14 days.     Recent labs reviewed. - I had a long discussion with her about the possible risk factors and  the pathology behind its diabetes and its complications. -her diabetes is complicated by comorbid hyperlipidemia and hypertension  and she remains at a high risk for more acute and chronic complications which include CAD, CVA, CKD, retinopathy, and neuropathy. These are all discussed in detail with her.  - I discussed all available options of managing her diabetes including de-escalation of medications. I have counseled her on Food as Medicine by adopting a Whole Food , Plant Predominant  ( WFPP) nutrition as recommended by [employer] of  Lifestyle Medicine. Patient is encouraged to switch to  unprocessed or minimally processed  complex starch, adequate protein intake (mainly plant source), minimal liquid fat, plenty of fruits, and vegetables. -  she is advised to stick to a routine mealtimes to eat 3 complete meals a day and snack only when necessary ( to snack only to correct hypoglycemia BG <70 day time or <100 at night).   - she acknowledges that there is a room for improvement in her food and drink choices. - Suggestion is made for her to avoid simple carbohydrates  from her diet including Cakes, Sweet Desserts, Ice Cream, Soda (diet and regular), Sweet Tea, Candies, Chips, Cookies, Store Bought Juices, Alcohol , Artificial Sweeteners,  Coffee Creamer, and "Sugar-free" Products, Lemonade. This will help patient to have more stable blood glucose profile and potentially avoid unintended weight gain.  The following Lifestyle Medicine recommendations according to American College of Lifestyle Medicine  [facility]) were discussed and and offered to patient and she  agrees to start the journey:  A. Whole Foods, Plant-Based Nutrition comprising of fruits and vegetables, plant-based proteins, whole-grain carbohydrates was discussed in detail with the patient.   A list for source of those nutrients were also provided to the patient.  Patient will use only water or unsweetened tea for hydration. B.  The need to stay away from risky substances including alcohol, smoking; obtaining 7  to 9 hours of restorative sleep, at least 150 minutes of moderate intensity exercise weekly, the importance of healthy social connections,  and stress management techniques were discussed. C.  A full color page of  Calorie density of various food groups per pound showing examples of each food groups was provided to the patient.    - she will be scheduled with Norm Salt, RDN, CDE for individualized diabetes education.  - I have approached her with the following  individualized plan to manage  her diabetes and patient agrees:   -In light of her presentation with worsening glycemic profile with point-of-care A1c of 9.5%, she was approached for basal insulin or GLP-1 receptor agonist.  At this point, patient declines these offers.   -She plans and promises to do better with her lifestyle.  -She is advised to increase her metformin to 1000 mg p.o. daily at breakfast (this was instructed also during her last visit) however It at 500 mg p.o. daily.   -She is also advised to continue Jardiance 25 mg p.o. daily at breakfast.   She is advised to continue to wear her CGM continuously, and advised to call back clinic if her weekly average is greater than 180 mg per DL.  -She has several options if she cannot control diabetes to target options including resuming Jardiance.    - Specific targets for  A1c;  LDL, HDL,  and Triglycerides were discussed with the patient.  2) Blood Pressure /Hypertension: -Her blood pressure is controlled to target.  she is advised to continue her current medications including losartan 50 mg p.o. daily, whole food plant-based diet discussed above will also help her control hypertension without further escalation of medications.     3) Lipids/Hyperlipidemia: Her previsit fasting lipid panel shows worsening LDL at 143 increasing from 66.  She has previously responded to lovastatin.  Advised to continue lovastatin 40 mg p.o. nightly.     4)  Weight/Diet:  Body mass index is 27.35 kg/m.  -   she is  a candidate for  some weight loss. I discussed with her the fact that loss of 5 - 10% of her  current body weight will have the most impact on her diabetes management.  The above detailed  ACLM recommendations for nutrition, exercise, sleep, social life, avoidance of risky substances, the need for restorative sleep   information will also detailed on discharge instructions.  5) Chronic Care/Health Maintenance:  -she  is on ACEI/ARB and  Statin medications and  is encouraged to initiate and continue to follow up with Ophthalmology, Dentist,  Podiatrist at least yearly or according to recommendations, and advised to   stay away from smoking. I have recommended yearly flu vaccine and pneumonia vaccine at least every 5 years; moderate intensity exercise for up to 150 minutes weekly; and  sleep for 7- 9 hours a day.  - she is  advised to maintain close follow up with [name], MD for primary care needs, as well as her other providers for optimal and coordinated care.   I spent  26  minutes in the care of the patient today including review of labs from CMP, Lipids, Thyroid Function, Hematology (current and previous including abstractions from other facilities); face-to-face time discussing  her blood glucose readings/logs, discussing hypoglycemia and hyperglycemia episodes and symptoms, medications doses, her options of short and long term treatment based on the latest standards of care / guidelines;  discussion about incorporating lifestyle medicine;  and documenting the encounter.  Risk reduction counseling performed per USPSTF guidelines to reduce  obesity and cardiovascular risk factors.     Please refer to Patient Instructions for Blood Glucose Monitoring and Insulin/Medications Dosing Guide"  in media tab for additional information. Please  also refer to " Patient Self Inventory" in the Media  tab for reviewed elements of pertinent patient history.  Jerl Mina participated in the discussions, expressed understanding, and voiced agreement with the above plans.  All questions were answered to her satisfaction. she is encouraged to contact clinic should she have any questions or concerns prior to her return visit.    Follow up plan: - No follow-ups on file.  Marquis Lunch, MD [facility] [address] [place], [place] [address] Phone: [phone]  Fax: [phone]     10/25/2023, 1:41 PM  This note was partially dictated with voice recognition software. Similar sounding words can be transcribed inadequately or may not  be corrected upon review.

## 2023-10-25 NOTE — Patient Instructions (Signed)

## 2023-11-02 ENCOUNTER — Encounter: Payer: Self-pay | Admitting: "Endocrinology

## 2023-11-09 ENCOUNTER — Other Ambulatory Visit: Payer: Self-pay | Admitting: *Deleted

## 2023-11-09 ENCOUNTER — Other Ambulatory Visit: Payer: Self-pay | Admitting: "Endocrinology

## 2023-11-09 DIAGNOSIS — E1165 Type 2 diabetes mellitus with hyperglycemia: Secondary | ICD-10-CM

## 2023-11-09 MED ORDER — METFORMIN HCL ER 500 MG PO TB24
1000.0000 mg | ORAL_TABLET | Freq: Every day | ORAL | 2 refills | Status: DC
Start: 2023-11-09 — End: 2024-01-31

## 2023-12-21 ENCOUNTER — Encounter: Payer: Self-pay | Admitting: "Endocrinology

## 2023-12-28 ENCOUNTER — Telehealth: Payer: Self-pay | Admitting: "Endocrinology

## 2023-12-28 NOTE — Telephone Encounter (Signed)
 Pt wanted to be seen prior to you going out of town due to low blood sugars and discussing her diet.  A1C will not be due then due to wanting to be seen sooner.

## 2024-01-01 ENCOUNTER — Other Ambulatory Visit: Payer: Self-pay | Admitting: "Endocrinology

## 2024-01-01 DIAGNOSIS — E1165 Type 2 diabetes mellitus with hyperglycemia: Secondary | ICD-10-CM

## 2024-01-08 ENCOUNTER — Ambulatory Visit: Admitting: "Endocrinology

## 2024-01-31 ENCOUNTER — Encounter: Payer: Self-pay | Admitting: "Endocrinology

## 2024-01-31 ENCOUNTER — Ambulatory Visit: Admitting: "Endocrinology

## 2024-01-31 VITALS — BP 98/64 | HR 108 | Ht 63.0 in | Wt 149.4 lb

## 2024-01-31 DIAGNOSIS — E1165 Type 2 diabetes mellitus with hyperglycemia: Secondary | ICD-10-CM | POA: Diagnosis not present

## 2024-01-31 DIAGNOSIS — E782 Mixed hyperlipidemia: Secondary | ICD-10-CM

## 2024-01-31 DIAGNOSIS — I10 Essential (primary) hypertension: Secondary | ICD-10-CM | POA: Diagnosis not present

## 2024-01-31 DIAGNOSIS — Z7984 Long term (current) use of oral hypoglycemic drugs: Secondary | ICD-10-CM

## 2024-01-31 LAB — POCT GLYCOSYLATED HEMOGLOBIN (HGB A1C): HbA1c, POC (controlled diabetic range): 7.5 % — AB (ref 0.0–7.0)

## 2024-01-31 LAB — POCT UA - MICROALBUMIN
Albumin/Creatinine Ratio, Urine, POC: <30
Creatinine, POC: 200 mg/dL
Microalbumin Ur, POC: 10 mg/L

## 2024-01-31 MED ORDER — METFORMIN HCL ER 500 MG PO TB24: 500.0000 mg | ORAL_TABLET | Freq: Every day | ORAL | 1 refills | Status: DC

## 2024-01-31 MED ORDER — JARDIANCE 25 MG PO TABS
25.0000 mg | ORAL_TABLET | Freq: Every day | ORAL | 1 refills | Status: DC
Start: 2024-01-31 — End: 2024-05-03

## 2024-01-31 NOTE — Progress Notes (Signed)
 01/31/2024, 5:45 PM   Endocrinology follow-up note  Subjective:    Patient ID: Michelle Mckenzie, female    DOB: 04-Jan-1961.  Michelle Mckenzie is being seen in follow-up after she was seen in consultation for management of currently uncontrolled symptomatic diabetes requested by  Monique Ano, MD.   Past Medical History:  Diagnosis Date   Allergy    Anxiety    Asthma    No flares in 20 years   Axillary mass, right 09/05/2018   Diabetes mellitus without complication (HCC)    Hypercholesterolemia    Hypertension    Insomnia    Osteopenia 11/19/2007   per dexa   PMS (premenstrual syndrome)    Psoriasis    Vitamin D deficiency 01/2013   per PCP;he wants us  to manage    Past Surgical History:  Procedure Laterality Date   BREAST BIOPSY Right 11/20/2017   right breast stereo  fibroadenomatous change   BREAST BIOPSY Right 06/08/2022   Right breast stereo bx, ribbon clip - path pending   CHOLECYSTECTOMY     COLONOSCOPY  12/01/2009   Normal, Murleen Arms, MD MI: 5-year follow-up exam recommended based on family history.   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 She attended [employer] and obtained her Bachelor's in [employer]. 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 She lives at home with her husband. They have 4 children Dana Duncan [address] [name], [name], [name]). She spend time going to soccer games. She enjoys reading but her favorite thing to do is going to the beach.    Social Drivers of Corporate investment banker Strain: Low Risk  (10/03/2023)   Received from [facility]   Overall Financial Resource Strain (CARDIA)    Difficulty of Paying Living Expenses: Not hard at all  Food Insecurity: No Food Insecurity (10/03/2023)   Received from [facility]   Hunger Vital Sign    Worried About Running Out of Food in the Last Year: Never true  Ran Out of Food in the Last Year: Never true  Transportation Needs: No Transportation Needs (10/03/2023)   Received from [facility] - Transportation    In the past 12 months, has lack of transportation kept you from medical appointments or from getting medications?: No    Lack of Transportation (Non-Medical): No  Physical Activity: Not on file  Stress: Not on file  Social Connections: Not on file    Family History  Problem Relation Age of Onset   Prostate cancer Father    Colon cancer Brother 102       gene neg   Arthritis Maternal Grandmother    Stroke Maternal Grandmother    Arthritis Maternal Grandfather        RA   Arthritis Paternal Grandmother    Heart disease Paternal Grandmother 12       CAD - MI - Died   Arthritis Paternal Grandfather    Breast cancer Neg Hx     Outpatient Encounter Medications as of 01/31/2024  Medication Sig   ACCU-CHEK GUIDE test strip USE TO CHECK BLOOD SUGARS 3-4X DAILY   Blood  Glucose Monitoring Suppl (FIFTY50 GLUCOSE METER 2.0) w/Device KIT Use to test blood sugar.  Please use whatever meter/strips are on formulary.   busPIRone (BUSPAR) 5 MG tablet Take 1 tablet by mouth 2 (two) times daily.   cetirizine (ZYRTEC) 10 MG tablet Take 10 mg by mouth daily.   clobetasol cream (TEMOVATE) 0.05 % 1(ONE) APPLICATION(S) TOPICAL 2(TWO) TIMES A DAY AS NEEDED FOR FLARES   Clobetasol Propionate 0.05 % shampoo WASH SCALP 3(THREE) TIMES A WEEK. LEAVE IT PLACE FOR 30 MINUTES BEFORE RINSING.   Continuous Glucose Sensor (FREESTYLE LIBRE 3 SENSOR) MISC 1 Piece by Does not apply route every 14 (fourteen) days. Place 1 sensor on the skin every 14 days. Use to check glucose continuously   fluticasone  (FLONASE ) 50 MCG/ACT nasal spray PLACE 2 SPRAYS INTO BOTH NOSTRILS DAILY. (Patient taking differently: Place 2 sprays into both nostrils daily as needed for allergies.)   guselkumab (TREMFYA) 100 MG/ML prefilled syringe Inject into the skin. Inject every 8 weeks   JARDIANCE  25 MG TABS tablet Take 1 tablet (25 mg total) by mouth daily.   losartan  (COZAAR ) 50 MG tablet Take 1 tablet (50 mg total) by mouth daily.   lovastatin (MEVACOR) 40 MG tablet Take 40 mg by mouth at bedtime.    metFORMIN  (GLUCOPHAGE -XR) 500 MG 24 hr tablet Take 1 tablet (500 mg total) by mouth daily with breakfast.   minoxidil (LONITEN) 2.5 MG tablet Take 1.25 mg by mouth 2 (two) times daily.   Multiple Vitamin (MULTI-VITAMINS) TABS Take 1 tablet by mouth daily.    ONETOUCH DELICA LANCETS 33G MISC USE TO TEST BLOOD SUGAR TWICE A DAY AS DIRECTED   spironolactone (ALDACTONE) 100 MG tablet Take 100 mg by mouth daily.   traZODone (DESYREL) 50 MG tablet TAKE 1 TABLET BY MOUTH EVERY DAY AT BEDTIME AS NEEDED FOR SLEEP   Vitamin D, Ergocalciferol, (DRISDOL) 1.25 MG (50000 UNIT) CAPS capsule Take 50,000 Units by mouth every 14 (fourteen) days.   [DISCONTINUED] JARDIANCE  25 MG TABS tablet TAKE 1 TABLET (25 MG TOTAL) BY MOUTH DAILY.    [DISCONTINUED] metFORMIN  (GLUCOPHAGE -XR) 500 MG 24 hr tablet Take 2 tablets (1,000 mg total) by mouth daily with breakfast. (Patient taking differently: Take 500 mg by mouth daily with breakfast.)   No facility-administered encounter medications on file as of 01/31/2024.  ALLERGIES: Allergies  Allergen Reactions   Cortisone Rash    Hives, Swelling   Lanolin Rash    VACCINATION STATUS: Immunization History  Administered Date(s) Administered   Influenza Inj Mdck Quad Pf 06/25/2019   Influenza-Unspecified 07/06/2018   Moderna Sars-Covid-2 Vaccination 09/14/2020   PFIZER(Purple Top)SARS-COV-2 Vaccination 12/18/2019, 01/15/2020    Diabetes She presents for her follow-up diabetic visit. She has type 2 diabetes mellitus. Onset time: She was diagnosed at approximate age of 83 years. Her disease course has been improving. There are no hypoglycemic associated symptoms. Pertinent negatives for hypoglycemia include no confusion, headaches, pallor or seizures. There are no diabetic associated symptoms. Pertinent negatives for diabetes include no chest pain, no polydipsia, no polyphagia and no polyuria. There are no hypoglycemic complications. Symptoms are improving. There are no diabetic complications. Risk factors for coronary artery disease include dyslipidemia, diabetes mellitus, family history, hypertension and post-menopausal. Her weight is decreasing steadily. She is following a generally unhealthy diet. Exercise: Her sizes regularly.  She has a plan to have parathyroid and a long walk in [place] sometime in the years. Her home blood glucose trend is decreasing steadily. Her breakfast blood glucose range is generally 130-140 mg/dl. Her lunch blood glucose range is generally 130-140 mg/dl. Her dinner blood glucose range is generally 130-140 mg/dl. Her bedtime blood glucose range is generally 130-140 mg/dl. Her overall blood glucose range is 130-140 mg/dl. (She wears a CGM device showing worsening  stomach profile: 40% time range, 49% Level One hypoglycemia, 11% level 2-Per glycemia.  Her point-of-care A1c 9.5%.  Her average blood glucose is 197 for the last 14 days.    ) An ACE inhibitor/angiotensin II receptor blocker is being taken. Eye exam is current.  Hyperlipidemia This is a chronic problem. The current episode started more than 1 year ago. Exacerbating diseases include diabetes. Pertinent negatives include no chest pain, myalgias or shortness of breath. Risk factors for coronary artery disease include family history, dyslipidemia, diabetes mellitus, hypertension and post-menopausal.  Hypertension This is a chronic problem. Pertinent negatives include no chest pain, headaches, palpitations or shortness of breath. Risk factors for coronary artery disease include dyslipidemia, diabetes mellitus and post-menopausal state. Past treatments include angiotensin blockers.     Review of Systems  Constitutional:  Negative for chills, fever and unexpected weight change.  HENT:  Negative for trouble swallowing and voice change.   Eyes:  Negative for visual disturbance.  Respiratory:  Negative for cough, shortness of breath and wheezing.   Cardiovascular:  Negative for chest pain, palpitations and leg swelling.  Gastrointestinal:  Negative for diarrhea, nausea and vomiting.  Endocrine: Negative for cold intolerance, heat intolerance, polydipsia, polyphagia and polyuria.  Musculoskeletal:  Negative for arthralgias and myalgias.  Skin:  Negative for color change, pallor, rash and wound.  Neurological:  Negative for seizures and headaches.  Psychiatric/Behavioral:  Negative for confusion and suicidal ideas.     Objective:       01/31/2024    2:33 PM 10/25/2023    1:33 PM 06/01/2023    8:44 AM  Vitals with BMI  Height 5\' 3"  5\' 3"  5\' 3"   Weight 149 lbs 6 oz 154 lbs 6 oz 155 lbs  BMI 26.47 27.36 27.46  Systolic 98 102 133  Diastolic 64 72 79  Pulse 108 84     BP 98/64   Pulse (!) 108    Ht 5\' 3"  (1.6 m)   Wt 149 lb 6.4 oz (67.8 kg)   BMI 26.47 kg/m  Wt Readings from Last 3 Encounters:  01/31/24 149 lb 6.4 oz (67.8 kg)  10/25/23 154 lb 6.4 oz (70 kg)  06/01/23 155 lb (70.3 kg)    Glucose 70 - 110 mg/dL 161 High    Sodium 096 - 145 mmol/L 135 Low    Potassium 3.6 - 5.1 mmol/L 4   Chloride 97 - 109 mmol/L 99   Carbon Dioxide (CO2) 22.0 - 32.0 mmol/L 27.6   Urea Nitrogen (BUN) 7 - 25 mg/dL 13   Creatinine 0.6 - 1.1 mg/dL 0.5 Low    Glomerular Filtration Rate (eGFR) >60 mL/min/1.73sq m 107      6 d ago   Cholesterol, Total 100 - 200 mg/dL 045  Triglyceride 35 - 199 mg/dL 409  HDL (High Density Lipoprotein) Cholesterol 35.0 - 85.0 mg/dL 81.1  LDL Calculated 0 - 130 mg/dL 53  VLDL Cholesterol mg/dL 23  Cholesterol/HDL Ratio 2.2    Diabetic Labs (most recent): Lab Results  Component Value Date   HGBA1C 7.5 (A) 01/31/2024   HGBA1C 7.4 (A) 02/08/2023   HGBA1C 7 10/12/2022   MICROALBUR 10 01/31/2024    Assessment & Plan:   1. Type 2 diabetes mellitus with hyperglycemia, without long-term current use of insulin (HCC)  - NAKESHA SAMORA has currently uncontrolled symptomatic type 2 DM since  63 years of age.   She wears a CGM device showing significantly improved glycemic profile averaging 136 over the last 14 days.  Her AGP is showing 89% time range, 10% slightly above target.  Her point-of-care A1c 7.5%, improving from 9.5% during her last visit.      Recent labs reviewed. - I had a long discussion with her about the possible risk factors and  the pathology behind its diabetes and its complications. -her diabetes is complicated by comorbid hyperlipidemia and hypertension  and she remains at a high risk for more acute and chronic complications which include CAD, CVA, CKD, retinopathy, and neuropathy. These are all discussed in detail with her.  - I discussed all available options of managing her diabetes including de-escalation of medications.  I have counseled her on Food as Medicine by adopting a Whole Food , Plant Predominant  ( WFPP) nutrition as recommended by [employer] of Lifestyle Medicine. Patient is encouraged to switch to  unprocessed or minimally processed  complex starch, adequate protein intake (mainly plant source), minimal liquid fat, plenty of fruits, and vegetables. -  she is advised to stick to a routine mealtimes to eat 3 complete meals a day and snack only when necessary ( to snack only to correct hypoglycemia BG <70 day time or <100 at night).   - she acknowledges that there is a room for improvement in her food and drink choices. - Suggestion is made for her to avoid simple carbohydrates  from her diet including Cakes, Sweet Desserts, Ice Cream, Soda (diet and regular), Sweet Tea, Candies, Chips, Cookies, Store Bought Juices, Alcohol , Artificial Sweeteners,  Coffee Creamer, and "Sugar-free" Products, Lemonade. This will help patient to have more stable blood glucose profile and potentially avoid unintended weight gain.  The following Lifestyle Medicine recommendations according to American College of Lifestyle Medicine  [facility]) were discussed and and offered to patient and she  agrees to start the journey:  A. Whole Foods, Plant-Based Nutrition comprising of fruits and vegetables, plant-based proteins, whole-grain carbohydrates was discussed in detail with the patient.   A list for source of those nutrients were also provided to the  patient.  Patient will use only water or unsweetened tea for hydration. B.  The need to stay away from risky substances including alcohol, smoking; obtaining 7 to 9 hours of restorative sleep, at least 150 minutes of moderate intensity exercise weekly, the importance of healthy social connections,  and stress management techniques were discussed. C.  A full color page of  Calorie density of various food groups per pound showing examples of each food groups was provided to the  patient.    - she will be scheduled with Penny Crumpton, RDN, CDE for individualized diabetes education.  - I have approached her with the following individualized plan to manage  her diabetes and patient agrees:   -In light of her presentation with near target glycemic profile, she will not need insulin treatment for now.  She did not accept an offer for GLP-1 receptor agonist.   - She is responding to her current regimen of Jardiance  25 mg p.o. daily at breakfast, and metformin  500 mg XR p.o. daily at breakfast.   She is advised to continue to wear her CGM continuously, and advised to call back clinic if her weekly average is greater than 180 mg per DL.  -She has several options if she cannot control diabetes to target options including resuming Jardiance .    - Specific targets for  A1c;  LDL, HDL,  and Triglycerides were discussed with the patient.  2) Blood Pressure /Hypertension: - Her blood pressure is controlled to target.  Her urine microalbumin is 10 mg/L today.  she is advised to continue her current medications including losartan  50 mg p.o. daily, whole food plant-based diet discussed above will also help her control hypertension without further escalation of medications.     3) Lipids/Hyperlipidemia: Her previsit fasting lipid panel shows significantly improved LDL at 53 improving from 143.  She is advised to continue lovastatin 40 mg p.o. nightly.  Side effects and precautions discussed with her.     4)  Weight/Diet:  Body mass index is 26.47 kg/m.  -   she is not a candidate for major weight loss.    I discussed with her the fact that loss of 5 - 10% of her  current body weight will have the most impact on her diabetes management.  The above detailed  ACLM recommendations for nutrition, exercise, sleep, social life, avoidance of risky substances, the need for restorative sleep   information will also detailed on discharge instructions.  5) Chronic Care/Health  Maintenance:  -she  is on ACEI/ARB and Statin medications and  is encouraged to initiate and continue to follow up with Ophthalmology, Dentist,  Podiatrist at least yearly or according to recommendations, and advised to   stay away from smoking. I have recommended yearly flu vaccine and pneumonia vaccine at least every 5 years; moderate intensity exercise for up to 150 minutes weekly; and  sleep for 7- 9 hours a day.  - she is  advised to maintain close follow up with Monique Ano, MD for primary care needs, as well as her other providers for optimal and coordinated care.   I spent  26  minutes in the care of the patient today including review of labs from CMP, Lipids, Thyroid Function, Hematology (current and previous including abstractions from other facilities); face-to-face time discussing  her blood glucose readings/logs, discussing hypoglycemia and hyperglycemia episodes and symptoms, medications doses, her options of short and long term treatment based on the latest standards of care / guidelines;  discussion  about incorporating lifestyle medicine;  and documenting the encounter. Risk reduction counseling performed per USPSTF guidelines to reduce  obesity and cardiovascular risk factors.     Please refer to Patient Instructions for Blood Glucose Monitoring and Insulin/Medications Dosing Guide"  in media tab for additional information. Please  also refer to " Patient Self Inventory" in the Media  tab for reviewed elements of pertinent patient history.  Christiana Cower participated in the discussions, expressed understanding, and voiced agreement with the above plans.  All questions were answered to her satisfaction. she is encouraged to contact clinic should she have any questions or concerns prior to her return visit.   Follow up plan: - Return in about 4 months (around 06/02/2024) for Bring Meter/CGM Device/Logs- A1c in Office.  Kalvin Orf, MD [facility] [address] [place], [place] [address] Phone: [phone]  Fax: [phone]    01/31/2024, 5:45 PM  This note was partially dictated with voice recognition software. Similar sounding words can be transcribed inadequately or may not  be corrected upon review.

## 2024-02-05 ENCOUNTER — Ambulatory Visit: Payer: 59 | Admitting: "Endocrinology

## 2024-02-29 ENCOUNTER — Encounter: Payer: Self-pay | Admitting: "Endocrinology

## 2024-03-01 ENCOUNTER — Other Ambulatory Visit: Payer: Self-pay

## 2024-03-01 DIAGNOSIS — E1165 Type 2 diabetes mellitus with hyperglycemia: Secondary | ICD-10-CM

## 2024-03-01 MED ORDER — FREESTYLE LIBRE 3 PLUS SENSOR MISC: 0 refills | Status: DC

## 2024-05-03 ENCOUNTER — Other Ambulatory Visit: Payer: Self-pay | Admitting: "Endocrinology

## 2024-05-03 DIAGNOSIS — E1165 Type 2 diabetes mellitus with hyperglycemia: Secondary | ICD-10-CM

## 2024-06-04 ENCOUNTER — Ambulatory Visit: Admitting: "Endocrinology

## 2024-06-06 ENCOUNTER — Other Ambulatory Visit: Payer: Self-pay | Admitting: "Endocrinology

## 2024-06-06 DIAGNOSIS — E1165 Type 2 diabetes mellitus with hyperglycemia: Secondary | ICD-10-CM

## 2024-06-27 ENCOUNTER — Encounter: Payer: Self-pay | Admitting: "Endocrinology

## 2024-06-28 ENCOUNTER — Other Ambulatory Visit: Payer: Self-pay

## 2024-06-28 DIAGNOSIS — E1165 Type 2 diabetes mellitus with hyperglycemia: Secondary | ICD-10-CM

## 2024-06-28 MED ORDER — ACCU-CHEK SOFTCLIX LANCETS MISC: 2 refills | Status: AC

## 2024-06-28 MED ORDER — ACCU-CHEK GUIDE ME W/DEVICE KIT
PACK | 0 refills | Status: AC
Start: 2024-06-28 — End: ?

## 2024-06-28 MED ORDER — ACCU-CHEK GUIDE TEST VI STRP: ORAL_STRIP | 2 refills | Status: AC

## 2024-07-04 ENCOUNTER — Ambulatory Visit: Admitting: "Endocrinology

## 2024-07-09 ENCOUNTER — Encounter: Payer: Self-pay | Admitting: Obstetrics and Gynecology

## 2024-07-09 ENCOUNTER — Ambulatory Visit (INDEPENDENT_AMBULATORY_CARE_PROVIDER_SITE_OTHER): Admitting: Obstetrics and Gynecology

## 2024-07-09 VITALS — BP 101/64 | HR 77 | Ht 63.0 in | Wt 147.0 lb

## 2024-07-09 DIAGNOSIS — Z23 Encounter for immunization: Secondary | ICD-10-CM | POA: Diagnosis not present

## 2024-07-09 DIAGNOSIS — R5383 Other fatigue: Secondary | ICD-10-CM | POA: Diagnosis not present

## 2024-07-09 DIAGNOSIS — D509 Iron deficiency anemia, unspecified: Secondary | ICD-10-CM | POA: Diagnosis not present

## 2024-07-09 DIAGNOSIS — Z01411 Encounter for gynecological examination (general) (routine) with abnormal findings: Secondary | ICD-10-CM

## 2024-07-09 DIAGNOSIS — Z0000 Encounter for general adult medical examination without abnormal findings: Secondary | ICD-10-CM

## 2024-07-09 DIAGNOSIS — Z1329 Encounter for screening for other suspected endocrine disorder: Secondary | ICD-10-CM

## 2024-07-09 DIAGNOSIS — Z1231 Encounter for screening mammogram for malignant neoplasm of breast: Secondary | ICD-10-CM

## 2024-07-09 DIAGNOSIS — L292 Pruritus vulvae: Secondary | ICD-10-CM | POA: Diagnosis not present

## 2024-07-09 DIAGNOSIS — Z01419 Encounter for gynecological examination (general) (routine) without abnormal findings: Secondary | ICD-10-CM

## 2024-07-09 MED ORDER — KETOCONAZOLE 2 % EX CREA: 1.0000 | TOPICAL_CREAM | Freq: Every day | CUTANEOUS | 1 refills | Status: AC

## 2024-07-09 NOTE — Patient Instructions (Addendum)
 I value your feedback and you entrusting [place] with your care. If you get a [facility] patient survey, I would appreciate you taking the time to let us know about your experience today. Thank you!  [facility] ([place])--[phone]

## 2024-07-09 NOTE — Progress Notes (Addendum)
 PCP: Alla Amis, MD   Chief Complaint  Patient presents with   Gynecologic Exam    No concerns    HPI:      [name] is a 63 y.o. No obstetric history on file. who LMP was No LMP recorded. Patient is postmenopausal., presents today for her annual examination.  Her menses are absent due to menopause. She does not have PMB/pelvic pain. She does not have vasomotor sx.   Sex activity: single partner, contraception - post menopausal status. She does have vaginal dryness, improved with lubricants. No pain/bleeding.   Has issues with persistent and frequent external itching, due to DM meds. Treats almost daily with OTC yeast vag meds that keeps sx stable but still has flares at time. Using dryer sheets and synthetic underwear, water to wash.   Last Pap: 03/16/23 Results were: no abnormalities /neg HPV DNA.  Hx of STDs: none  Last mammogram: 9/Mckenzie/23 Results were: normal after neg breast bx; repeat in 12 months.  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  Pt with RT axillary mass for many yrs; larger and more tender last yr. Had exc with Dr. Dessa and shown to be benign fatty tissue.  There is no FH of breast cancer. There is no FH of ovarian cancer. The patient does do self-breast exams.  Colonoscopy: colonoscopy 10/2021  at [facility], no polyps. Thinks due again in 10 yrs. 2011 without abnormalities with Dr. Ora.  Repeat due after 10 years. There is FH of colon cancer in her brother, who is gene neg.  DEXA: 2009--osteopenia in spine  Tobacco use: The patient denies current or previous tobacco use. Alcohol use: none No drug use Exercise: moderately active  She does get adequate calcium but not Vitamin D in her diet. Labs with PCP.  Pt with significant fatigue, always feels cold. Take B complex supp but not Fe. CBC with PCP 1/25 with normal H/H but low MCV and MCH. No recent thyroid labs.   Past Medical History:  Diagnosis Date   Allergy     Anxiety    Asthma    No flares in 20 years   Axillary mass, right 09/05/2018   Diabetes mellitus without complication (HCC)    Hypercholesterolemia    Hypertension    Insomnia    Osteopenia 11/19/2007   per dexa   PMS (premenstrual syndrome)    Psoriasis    Vitamin D deficiency 01/2013   per PCP;he wants us  to manage    Past Surgical History:  Procedure Laterality Date   BREAST BIOPSY Right 11/20/2017   right breast stereo  fibroadenomatous change   BREAST BIOPSY Right 09/Mckenzie/2023   Right breast stereo bx, ribbon clip - path pending   CHOLECYSTECTOMY     COLONOSCOPY  12/01/2009   Normal, Deward Ora, MD MI: 5-year follow-up exam recommended based on family history.   COLPOSCOPY  2010   MASS EXCISION Right 09/10/2018   Procedure: EXCISION AXILLARY MASS;  Surgeon: Dessa Reyes ORN, MD;  Location: [facility] ORS;  Service: General;  Laterality: Right;   TUBAL LIGATION  2001   UPPER GASTROINTESTINAL ENDOSCOPY  2014    Family History  Problem Relation Age of Onset   Prostate cancer Father    Colon cancer Brother 17       gene neg   Arthritis Maternal Grandmother    Stroke Maternal Grandmother    Arthritis Maternal Grandfather        RA  Arthritis Paternal Grandmother    Heart disease Paternal Grandmother 21       CAD - MI - Died   Arthritis Paternal Grandfather    Breast cancer Neg Hx     Social History   Socioeconomic History   Marital status: Married    Spouse name: Michelle Mckenzie   Number of children: 4   Years of education: 16   Highest education level: Not on file  Occupational History   Occupation: Runner, 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 She attended [employer] and obtained her Bachelor's in [employer]. 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 She lives at home with her husband. They have 4 children Michelle [address] Michelle [address] Michelle Mckenzie, Michelle Mckenzie, Michelle Mckenzie). She spend time going to soccer games. She enjoys reading but her favorite thing to do is going to the beach.    Social Drivers of Corporate investment banker Strain: Low Risk  (10/03/2023)   Received from [facility]   Overall Financial Resource Strain (CARDIA)    Difficulty of Paying Living Expenses: Not hard at all  Food Insecurity: No Food Insecurity (10/03/2023)   Received from [facility]   Hunger Vital Sign    Within the past 12 months, you worried that your food would run out before you got the money to buy more.: Never true    Within the past 12 months, the food you bought just didn't last and you didn't have money to get more.: Never true  Transportation Needs: No Transportation Needs (10/03/2023)   Received from [facility]    In the past 12 months, has lack of transportation kept you from medical appointments or from getting medications?: No    Lack of Transportation (Non-Medical): No  Physical Activity: Not on file  Stress: Not on file  Social Connections: Not on file  Intimate Partner Violence: Not on file    Current Meds  Medication Sig   Accu-Chek Softclix Lancets lancets Use to check blood glucose daily as instructed   BIMZELX 160 MG/ML pen Inject 160 mg into the skin.   Blood Glucose Monitoring Suppl (ACCU-CHEK GUIDE ME) w/Device KIT Use to check blood glucose daily as directed   busPIRone (BUSPAR) 5 MG tablet Take 1 tablet by mouth 2 (two) times daily.   cetirizine (ZYRTEC) 10 MG tablet Take 10 mg by mouth daily.   clobetasol  cream (TEMOVATE) 0.05 % 1(ONE) APPLICATION(S) TOPICAL 2(TWO) TIMES A DAY AS NEEDED FOR FLARES   Clobetasol Propionate 0.05 % shampoo WASH SCALP 3(THREE) TIMES A WEEK. LEAVE IT PLACE FOR 30 MINUTES BEFORE RINSING.   Continuous Glucose Sensor (FREESTYLE LIBRE 3 PLUS SENSOR) MISC USE TO MONITOR GLUCOSE CONTINUOUSLY AS INSTRUCTED. CHANGE SENSOR EVERY Mckenzie DAYS.   fluticasone  (FLONASE ) 50 MCG/ACT nasal spray PLACE 2 SPRAYS INTO BOTH NOSTRILS DAILY. (Patient taking differently: Place 2 sprays into both nostrils daily as needed  for allergies.)   glucose blood (ACCU-CHEK GUIDE TEST) test strip Use to check blood glucose daily as instructed   JARDIANCE  25 MG TABS tablet TAKE 1 TABLET (25 MG TOTAL) BY MOUTH DAILY.   ketoconazole (NIZORAL) 2 % cream Apply 1 Application topically daily. For 2-4 wks or prn sx   losartan  (COZAAR ) 50 MG tablet Take 1 tablet (50 mg total) by mouth daily.   lovastatin (MEVACOR) 40 MG tablet Take 40 mg by mouth at bedtime.    metFORMIN  (GLUCOPHAGE -XR) 500 MG 24 hr tablet Take 1 tablet (500 mg total) by mouth daily with breakfast.   minoxidil (LONITEN) 2.5 MG tablet Take 1.25 mg by mouth 2 (two) times daily.   Multiple Vitamin (MULTI-VITAMINS) TABS Take 1 tablet by mouth daily.    spironolactone (ALDACTONE) 100 MG tablet Take 100 mg by mouth daily.   traZODone (DESYREL) 50 MG tablet TAKE 1 TABLET BY MOUTH EVERY DAY AT BEDTIME AS NEEDED FOR SLEEP      ROS:  Review of Systems  Constitutional:  Positive for fatigue. Negative for fever and unexpected weight change.  Respiratory:  Negative for cough, shortness of breath and wheezing.   Cardiovascular:  Negative for chest pain, palpitations and leg swelling.  Gastrointestinal:  Negative for blood in stool, constipation, diarrhea, nausea and vomiting.  Endocrine: Negative for cold intolerance, heat intolerance and polyuria.  Genitourinary:  Negative for dyspareunia, dysuria, flank pain, frequency, genital sores, hematuria, menstrual  problem, pelvic pain, urgency, vaginal bleeding, vaginal discharge and vaginal pain.  Musculoskeletal:  Negative for back pain, joint swelling and myalgias.  Skin:  Negative for rash.  Neurological:  Negative for dizziness, syncope, light-headedness, numbness and headaches.  Hematological:  Negative for adenopathy.  Psychiatric/Behavioral:  Negative for agitation, confusion, sleep disturbance and suicidal ideas. The patient is not nervous/anxious.      Objective: BP 101/64   Pulse 77   Ht 5' 3 (1.6 m)   Wt 147 lb (66.7 kg)   BMI 26.04 kg/m    Physical Exam Constitutional:      Appearance: She is well-developed.  Genitourinary:     Vulva normal.     Right Labia: No rash, tenderness or lesions.    Left Labia: No tenderness, lesions or rash.       No vaginal discharge, erythema or tenderness.      Right Adnexa: not tender and no mass present.    Left Adnexa: not tender and no mass present.    No cervical friability or polyp.     Uterus is not enlarged or tender.  Breasts:    Right: No mass, nipple discharge, skin change or tenderness.     Left: No mass, nipple discharge, skin change or tenderness.  Neck:     Thyroid: No thyromegaly.  Cardiovascular:     Rate and Rhythm: Normal rate and regular rhythm.     Heart sounds: Normal heart sounds. No murmur heard. Pulmonary:     Effort: Pulmonary effort is normal.     Breath sounds: Normal breath sounds.  Abdominal:     Palpations: Abdomen is soft.     Tenderness: There is no abdominal tenderness. There is no guarding or rebound.  Musculoskeletal:        General: Normal range of motion.     Cervical back: Normal range of motion.  Lymphadenopathy:     Cervical: No cervical adenopathy.  Neurological:     General: No focal deficit present.     Mental Status: She  is alert and oriented to person, place, and time.     Cranial Nerves: No cranial nerve deficit.  Skin:    General: Skin is warm and dry.  Psychiatric:         Mood and Affect: Mood normal.        Behavior: Behavior normal.        Thought Content: Thought content normal.        Judgment: Judgment normal.  Vitals reviewed.     Assessment/Plan: Encounter for annual routine gynecological examination  Encounter for screening mammogram for malignant neoplasm of breast - Plan: MM 3D SCREENING MAMMOGRAM BILATERAL BREAST; pt to schedule mammo  Vulvar itching - Plan: ketoconazole (NIZORAL) 2 % cream; recurrent sx due to DM, try treating with ketoconazole. Cotton underwear, line dry. F/u prn.   Microcytic anemia - Plan: CBC with Differential/Platelet, B12 and Folate Panel, Iron, TIBC and Ferritin Panel; check more labs, Michelle f/u with results.   Blood tests for routine general physical examination - Plan: CBC with Differential/Platelet, B12 and Folate Panel, Iron, TIBC and Ferritin Panel  Fatigue, unspecified type - Plan: CBC with Differential/Platelet, B12 and Folate Panel, Iron, TIBC and Ferritin Panel, TSH + free T4  Thyroid disorder screening - Plan: TSH + free T4  Encounter for immunization - Plan: Flu vaccine trivalent PF, 6mos and older(Flulaval,Afluria,Fluarix,Fluzone)  Meds ordered this encounter  Medications   ketoconazole (NIZORAL) 2 % cream    Sig: Apply 1 Application topically daily. For 2-4 wks or prn sx    Dispense:  60 g    Refill:  1    Supervising Provider:   ROBY, MICIA [[id number]]    GYN counsel mammography screening, adequate intake of calcium and vitamin D, diet and exercise    F/U  Return in about 1 year (around 07/09/2025).  Michelle Leeman B. Rosabel Sermeno, PA-C 07/09/2024 5:26 PM

## 2024-07-10 ENCOUNTER — Ambulatory Visit: Payer: Self-pay | Admitting: Obstetrics and Gynecology

## 2024-07-10 LAB — IRON,TIBC AND FERRITIN PANEL
Ferritin: 68 ng/mL (ref 15–150)
Iron Saturation: 14 % — ABNORMAL LOW (ref 15–55)
Iron: 54 ug/dL (ref 27–139)
Total Iron Binding Capacity: 387 ug/dL (ref 250–450)
UIBC: 333 ug/dL (ref 118–369)

## 2024-07-10 LAB — CBC WITH DIFFERENTIAL/PLATELET
Basophils Absolute: 0 x10E3/uL (ref 0.0–0.2)
Basos: 1 %
EOS (ABSOLUTE): 0.1 x10E3/uL (ref 0.0–0.4)
Eos: 3 %
Hematocrit: 40.3 % (ref 34.0–46.6)
Hemoglobin: 13.2 g/dL (ref 11.1–15.9)
Immature Grans (Abs): 0 x10E3/uL (ref 0.0–0.1)
Immature Granulocytes: 0 %
Lymphocytes Absolute: 1.6 x10E3/uL (ref 0.7–3.1)
Lymphs: 42 %
MCH: 28.1 pg (ref 26.6–33.0)
MCHC: 32.8 g/dL (ref 31.5–35.7)
MCV: 86 fL (ref 79–97)
Monocytes Absolute: 0.4 x10E3/uL (ref 0.1–0.9)
Monocytes: 10 %
Neutrophils Absolute: 1.7 x10E3/uL (ref 1.4–7.0)
Neutrophils: 44 %
Platelets: 254 x10E3/uL (ref 150–450)
RBC: 4.7 x10E6/uL (ref 3.77–5.28)
RDW: 13.2 % (ref 11.7–15.4)
WBC: 3.9 x10E3/uL (ref 3.4–10.8)

## 2024-07-10 LAB — TSH+FREE T4
Free T4: 1.09 ng/dL (ref 0.82–1.77)
TSH: 1.53 u[IU]/mL (ref 0.450–4.500)

## 2024-07-10 LAB — B12 AND FOLATE PANEL
Folate: >20 ng/mL (ref >3.0)
Vitamin B-12: 941 pg/mL (ref 232–1245)

## 2024-08-08 ENCOUNTER — Ambulatory Visit: Admitting: "Endocrinology

## 2024-08-08 ENCOUNTER — Encounter: Payer: Self-pay | Admitting: "Endocrinology

## 2024-08-08 VITALS — BP 92/66 | HR 92 | Ht 63.0 in | Wt 145.4 lb

## 2024-08-08 DIAGNOSIS — E782 Mixed hyperlipidemia: Secondary | ICD-10-CM | POA: Diagnosis not present

## 2024-08-08 DIAGNOSIS — I10 Essential (primary) hypertension: Secondary | ICD-10-CM

## 2024-08-08 DIAGNOSIS — E1165 Type 2 diabetes mellitus with hyperglycemia: Secondary | ICD-10-CM

## 2024-08-08 DIAGNOSIS — Z7984 Long term (current) use of oral hypoglycemic drugs: Secondary | ICD-10-CM | POA: Diagnosis not present

## 2024-08-08 LAB — POCT GLYCOSYLATED HEMOGLOBIN (HGB A1C): HbA1c, POC (controlled diabetic range): 7.5 % — AB (ref 0.0–7.0)

## 2024-08-08 NOTE — Progress Notes (Signed)
 08/08/2024, 3:48 PM   Endocrinology follow-up note  Subjective:    Patient ID: Michelle Mckenzie, female    DOB: 1960/11/30.  Michelle Mckenzie is being seen in follow-up after she was seen in consultation for management of currently uncontrolled symptomatic diabetes requested by  Alla Amis, MD.   Past Medical History:  Diagnosis Date   Allergy    Anxiety    Asthma    No flares in 20 years   Axillary mass, right 09/05/2018   Diabetes mellitus without complication (HCC)    Hypercholesterolemia    Hypertension    Insomnia    Osteopenia 11/19/2007   per dexa   PMS (premenstrual syndrome)    Psoriasis    Vitamin D deficiency 01/2013   per PCP;he wants us  to manage    Past Surgical History:  Procedure Laterality Date   BREAST BIOPSY Right 11/20/2017   right breast stereo  fibroadenomatous change   BREAST BIOPSY Right 06/08/2022   Right breast stereo bx, ribbon clip - path pending   CHOLECYSTECTOMY     COLONOSCOPY  12/01/2009   Normal, Deward Piedmont, MD MI: 5-year follow-up exam recommended based on family history.   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 She attended [employer] and obtained her Bachelor's in [employer]. 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 She lives at home with her husband. They have 4 children Sharlett [address] [name], [name], [name]). She spend time going to soccer games. She enjoys reading but her favorite thing to do is going to the beach.    Social Drivers of Corporate Investment Banker Strain: Low Risk  (10/03/2023)   Received [facility]   Overall Financial Resource Strain (CARDIA)    Difficulty of Paying Living Expenses: Not hard at all  Food Insecurity: No Food Insecurity (10/03/2023)   Received from [facility]   Hunger Vital Sign    Within the past 12 months, you worried that your food would run  out before you got the money to buy more.: Never true    Within the past 12 months, the food you bought just didn't last and you didn't have money to get more.: Never true  Transportation Needs: No Transportation Needs (10/03/2023)   Received from [facility] - Transportation    In the past 12 months, has lack of transportation kept you from medical appointments or from getting medications?: No    Lack of Transportation (Non-Medical): No  Physical Activity: Not on file  Stress: Not on file  Social Connections: Not on file    Family History  Problem Relation Age of Onset   Prostate cancer Father    Colon cancer Brother 22       gene neg   Arthritis Maternal Grandmother    Stroke Maternal Grandmother    Arthritis Maternal Grandfather        RA   Arthritis Paternal Grandmother    Heart disease Paternal Grandmother 75       CAD - MI - Died   Arthritis Paternal Grandfather    Breast cancer Neg Hx     Outpatient Encounter  Medications as of 08/08/2024  Medication Sig   metFORMIN  (GLUCOPHAGE -XR) 500 MG 24 hr tablet Take 1 tablet (500 mg total) by mouth daily with breakfast. (Patient taking differently: Take 1,000 mg by mouth daily with breakfast.)   Accu-Chek Softclix Lancets lancets Use to check blood glucose daily as instructed   BIMZELX 160 MG/ML pen Inject 160 mg into the skin.   Blood Glucose Monitoring Suppl (ACCU-CHEK GUIDE ME) w/Device KIT Use to check blood glucose daily as directed   busPIRone (BUSPAR) 5 MG tablet Take 1 tablet by mouth 2 (two) times daily.   cetirizine (ZYRTEC) 10 MG tablet Take 10 mg by mouth daily.   clobetasol cream (TEMOVATE) 0.05 % 1(ONE) APPLICATION(S) TOPICAL 2(TWO) TIMES A DAY AS NEEDED FOR FLARES   Clobetasol Propionate 0.05 % shampoo WASH SCALP 3(THREE) TIMES A WEEK. LEAVE IT PLACE FOR 30 MINUTES BEFORE RINSING.   Continuous Glucose Sensor (FREESTYLE LIBRE 3 PLUS SENSOR) MISC USE TO MONITOR GLUCOSE CONTINUOUSLY AS INSTRUCTED. CHANGE SENSOR EVERY 15 DAYS.   fluticasone  (FLONASE ) 50 MCG/ACT nasal spray PLACE 2 SPRAYS INTO BOTH NOSTRILS DAILY. (Patient taking differently: Place 2 sprays into both nostrils daily as needed for allergies.)   glucose blood (ACCU-CHEK GUIDE TEST) test strip Use to check blood glucose daily as instructed   JARDIANCE  25 MG TABS tablet TAKE 1 TABLET (25 MG TOTAL) BY MOUTH DAILY.   ketoconazole (NIZORAL) 2 % cream Apply 1 Application topically daily. For 2-4 wks or prn sx   losartan  (COZAAR ) 50 MG tablet Take 1 tablet (50 mg total) by mouth daily.   lovastatin (MEVACOR) 40 MG tablet Take 40 mg by mouth at bedtime.    minoxidil (LONITEN) 2.5 MG tablet Take 1.25 mg by mouth 2 (two) times daily.   Multiple Vitamin (MULTI-VITAMINS) TABS Take 1 tablet by mouth daily.    spironolactone (ALDACTONE) 100 MG tablet Take 100 mg by mouth daily.   traZODone (DESYREL) 50 MG tablet TAKE 1 TABLET BY MOUTH EVERY DAY AT BEDTIME AS NEEDED FOR SLEEP   No  facility-administered encounter medications on file as of 08/08/2024.    ALLERGIES: Allergies  Allergen Reactions   Cortisone Rash, Dermatitis and Hives    Hives, Swelling  Prior cortisone injection to knee    Hives, Swelling   Lanolin Rash    VACCINATION  STATUS: Immunization History  Administered Date(s) Administered   Influenza Inj Mdck Quad Pf 06/25/2019   Influenza, Seasonal, Injecte, Preservative Fre 07/09/2024   Influenza-Unspecified 07/06/2018   Moderna Sars-Covid-2 Vaccination 09/14/2020   PFIZER(Purple Top)SARS-COV-2 Vaccination 12/18/2019, 01/15/2020    Diabetes She presents for her follow-up diabetic visit. She has type 2 diabetes mellitus. Onset time: She was diagnosed at approximate age of 84 years. Her disease course has been fluctuating. There are no hypoglycemic associated symptoms. Pertinent negatives for hypoglycemia include no confusion, headaches, pallor or seizures. There are no diabetic associated symptoms. Pertinent negatives for diabetes include no chest pain, no polydipsia, no polyphagia and no polyuria. There are no hypoglycemic complications. Symptoms are improving. There are no diabetic complications. Risk factors for coronary artery disease include dyslipidemia, diabetes mellitus, family history, hypertension and post-menopausal. Her weight is decreasing steadily. She is following a generally unhealthy diet. Exercise: Her sizes regularly.  She has a plan to have parathyroid and a long walk in [place] sometime in the years. Her home blood glucose trend is fluctuating minimally. Her breakfast blood glucose range is generally 130-140 mg/dl. Her lunch blood glucose range is generally 130-140 mg/dl. Her dinner blood glucose range is generally 130-140 mg/dl. Her bedtime blood glucose range is generally 130-140 mg/dl. Her overall blood glucose range is 130-140 mg/dl. (She wears a CGM device showing worsening stomach profile: 86% time range, 11% Level 1 hypoglycemia, 3%  level 2- hyperglycemia.  Her point-of-care A1c is 7.5%, overall improving from 9.5%.  Her average blood glucose is 137 mg per DL for her most recent 2 weeks.  She has no hypoglycemia.    ) An ACE inhibitor/angiotensin II receptor blocker is being taken. Eye exam is current.  Hyperlipidemia This is a chronic problem. The current episode started more than 1 year ago. Exacerbating diseases include diabetes. Pertinent negatives include no chest pain, myalgias or shortness of breath. Risk factors for coronary artery disease include family history, dyslipidemia, diabetes mellitus, hypertension and post-menopausal.  Hypertension This is a chronic problem. Pertinent negatives include no chest pain, headaches, palpitations or shortness of breath. Risk factors for coronary artery disease include dyslipidemia, diabetes mellitus and post-menopausal state. Past treatments include angiotensin blockers.     Objective:       08/08/2024    2:04 PM 07/09/2024    3:19 PM 01/31/2024    2:33 PM  Vitals with BMI  Height 5' 3 5' 3 5' 3  Weight 145 lbs 6 oz 147 lbs 149 lbs 6 oz  BMI 25.76 26.05 26.47  Systolic 92 101 98  Diastolic 66 64 64  Pulse 92 77 108    BP 92/66   Pulse 92   Ht 5' 3 (1.6 m)   Wt 145 lb 6.4 oz (66 kg)   BMI 25.76 kg/m   Wt Readings from Last 3 Encounters:  08/08/24 145 lb 6.4 oz (66 kg)  07/09/24 147 lb (66.7 kg)  01/31/24 149 lb 6.4 oz (67.8 kg)    Glucose 70 - 110 mg/dL 780 High    Sodium 863 - 145 mmol/L 135 Low    Potassium 3.6 - 5.1 mmol/L 4   Chloride 97 - 109 mmol/L 99   Carbon Dioxide (CO2) 22.0 - 32.0 mmol/L 27.6   Urea Nitrogen (BUN) 7 - 25 mg/dL 13   Creatinine 0.6 - 1.1 mg/dL 0.5 Low    Glomerular Filtration Rate (eGFR) >60 mL/min/1.73sq m 107      6 d ago   Cholesterol, Total  100 - 200 mg/dL 862  Triglyceride 35 - 199 mg/dL 884  HDL (High Density Lipoprotein) Cholesterol 35.0 - 85.0 mg/dL 38.7  LDL Calculated 0 - 130 mg/dL 53  VLDL  Cholesterol mg/dL 23  Cholesterol/HDL Ratio 2.2    Diabetic Labs (most recent): Lab Results  Component Value Date   HGBA1C 7.5 (A) 08/08/2024   HGBA1C 7.5 (A) 01/31/2024   HGBA1C 7.4 (A) 02/08/2023   MICROALBUR 10 01/31/2024   Recent Results (from the past 2160 hours)  CBC with Differential/Platelet     Status: None   Collection Time: 07/09/24  3:52 PM  Result Value Ref Range   WBC 3.9 3.4 - 10.8 x10E3/uL   RBC 4.70 3.77 - 5.28 x10E6/uL   Hemoglobin 13.2 11.1 - 15.9 g/dL   Hematocrit 59.6 65.9 - 46.6 %   MCV 86 79 - 97 fL   MCH 28.1 26.6 - 33.0 pg   MCHC 32.8 31.5 - 35.7 g/dL   RDW 86.7 88.2 - 84.5 %   Platelets 254 150 - 450 x10E3/uL   Neutrophils 44 Not Estab. %   Lymphs 42 Not Estab. %   Monocytes 10 Not Estab. %   Eos 3 Not Estab. %   Basos 1 Not Estab. %   Neutrophils Absolute 1.7 1.4 - 7.0 x10E3/uL   Lymphocytes Absolute 1.6 0.7 - 3.1 x10E3/uL   Monocytes Absolute 0.4 0.1 - 0.9 x10E3/uL   EOS (ABSOLUTE) 0.1 0.0 - 0.4 x10E3/uL   Basophils Absolute 0.0 0.0 - 0.2 x10E3/uL   Immature Granulocytes 0 Not Estab. %   Immature Grans (Abs) 0.0 0.0 - 0.1 x10E3/uL  B12 and Folate Panel     Status: None   Collection Time: 07/09/24  3:52 PM  Result Value Ref Range   Vitamin B-12 941 232 - 1,245 pg/mL   Folate >20.0 >3.0 ng/mL    Comment: A serum folate concentration of less than 3.1 ng/mL is considered to represent clinical deficiency.   Iron, TIBC and Ferritin Panel     Status: Abnormal   Collection Time: 07/09/24  3:52 PM  Result Value Ref Range   Total Iron Binding Capacity 387 250 - 450 ug/dL   UIBC 666 881 - 630 ug/dL   Iron 54 27 - 860 ug/dL   Iron Saturation 14 (L) 15 - 55 %   Ferritin 68 15 - 150 ng/mL  TSH + free T4     Status: None   Collection Time: 07/09/24  3:52 PM  Result Value Ref Range   TSH 1.530 0.450 - 4.500 uIU/mL   Free T4 1.09 0.82 - 1.77 ng/dL  HgB J8r     Status: Abnormal   Collection Time: 08/08/24  2:22 PM  Result Value Ref Range    Hemoglobin A1C     HbA1c POC (<> result, manual entry)     HbA1c, POC (prediabetic range)     HbA1c, POC (controlled diabetic range) 7.5 (A) 0.0 - 7.0 %     Assessment & Plan:   1. Type 2 diabetes mellitus with hyperglycemia, without long-term current use of insulin (HCC)  - Michelle Mckenzie has currently uncontrolled symptomatic type 2 DM since  63 years of age.   She wears a CGM device showing worsening stomach profile: 86% time range, 11% Level 1 hypoglycemia, 3% level 2- hyperglycemia.  Her point-of-care A1c is 7.5%, overall improving from 9.5%.  Her average blood glucose is 137 mg per DL for her most recent 2 weeks.  She  has no hypoglycemia.      Recent labs reviewed. - I had a long discussion with her about the possible risk factors and  the pathology behind its diabetes and its complications. -her diabetes is complicated by comorbid hyperlipidemia and hypertension  and she remains at a high risk for more acute and chronic complications which include CAD, CVA, CKD, retinopathy, and neuropathy. These are all discussed in detail with her.  - I discussed all available options of managing her diabetes including de-escalation of medications. I have counseled her on Food as Medicine by adopting a Whole Food , Plant Predominant  ( WFPP) nutrition as recommended by [employer] of Lifestyle Medicine. Patient is encouraged to switch to  unprocessed or minimally processed  complex starch, adequate protein intake (mainly plant source), minimal liquid fat, plenty of fruits, and vegetables. -  she is advised to stick to a routine mealtimes to eat 3 complete meals a day and snack only when necessary ( to snack only to correct hypoglycemia BG <70 day time or <100 at night).  - she acknowledges that there is a room for improvement in her food and drink choices. - Suggestion is made for her to avoid simple carbohydrates  from her diet including Cakes, Sweet Desserts, Ice Cream, Soda (diet and  regular), Sweet Tea, Candies, Chips, Cookies, Store Bought Juices, Alcohol , Artificial Sweeteners,  Coffee Creamer, and Sugar-free Products, Lemonade. This will help patient to have more stable blood glucose profile and potentially avoid unintended weight gain.   - she will be scheduled with Penny Crumpton, RDN, CDE for individualized diabetes education.  - I have approached her with the following individualized plan to manage  her diabetes and patient agrees:   -In light of her presentation with near target glycemic profile, she will not need insulin treatment for now.  She will not need GLP-1 receptor agonist either, did not have side effects from Ozempic in the past.   - She is responding to her current regimen of Jardiance  25 mg p.o. daily at breakfast, and metformin  1000 mg XR p.o. daily at breakfast.   She is advised to continue to wear her CGM continuously, and advised to call back clinic if her weekly average is greater than 180 mg per DL.  -She has several options if she cannot control diabetes to target options including resuming Jardiance .    - Specific targets for  A1c;  LDL, HDL,  and Triglycerides were discussed with the patient.  2) Blood Pressure /Hypertension: Her blood pressure is controlled to target.  Her urine microalbumin is 10 mg/L today.  she is advised to continue her current medications including losartan  50 mg p.o. daily, whole food plant-based diet discussed above will also help her control hypertension without further escalation of medications.     3) Lipids/Hyperlipidemia: Her previsit fasting lipid panel shows significantly improved LDL at 53 improving from 143.  She is advised to continue lovastatin 40 mg p.o. nightly.   Side effects and precautions discussed with her.     4)  Weight/Diet:  Body mass index is 25.76 kg/m.  -   she is not a candidate for major weight loss.    I discussed with her the fact that loss of 5 - 10% of her  current body weight  will have the most impact on her diabetes management.  The above detailed  ACLM recommendations for nutrition, exercise, sleep, social life, avoidance of risky substances, the need for restorative sleep  information will also detailed on discharge instructions.  5) Chronic Care/Health Maintenance:  -she  is on ACEI/ARB and Statin medications and  is encouraged to initiate and continue to follow up with Ophthalmology, Dentist,  Podiatrist at least yearly or according to recommendations, and advised to   stay away from smoking. I have recommended yearly flu vaccine and pneumonia vaccine at least every 5 years; moderate intensity exercise for up to 150 minutes weekly; and  sleep for 7- 9 hours a day.  - she is  advised to maintain close follow up with Alla Amis, MD for primary care needs, as well as her other providers for optimal and coordinated care.   I spent  26  minutes in the care of the patient today including review of labs from CMP, Lipids, Thyroid Function, Hematology (current and previous including abstractions from other facilities); face-to-face time discussing  her blood glucose readings/logs, discussing hypoglycemia and hyperglycemia episodes and symptoms, medications doses, her options of short and long term treatment based on the latest standards of care / guidelines;  discussion about incorporating lifestyle medicine;  and documenting the encounter. Risk reduction counseling performed per USPSTF guidelines to reduce cardiovascular risk factors.     Please refer to Patient Instructions for Blood Glucose Monitoring and Insulin/Medications Dosing Guide  in media tab for additional information. Please  also refer to  Patient Self Inventory in the Media  tab for reviewed elements of pertinent patient history.  Michelle Mckenzie Holy participated in the discussions, expressed understanding, and voiced agreement with the above plans.  All questions were answered to her satisfaction. she  is encouraged to contact clinic should she have any questions or concerns prior to her return visit.   Follow up plan: - Return in about 6 months (around 02/05/2025) for F/U with Pre-visit Labs, Meter/CGM/Logs, A1c here.  Ranny Earl, MD [facility] [address] [place], [place] [address] Phone: [phone]  Fax: [phone]    08/08/2024, 3:48 PM  This note was partially dictated with voice recognition software. Similar sounding words can be transcribed inadequately or may not  be corrected upon review.

## 2024-08-29 ENCOUNTER — Encounter

## 2024-09-03 ENCOUNTER — Other Ambulatory Visit: Payer: Self-pay | Admitting: "Endocrinology

## 2024-09-03 DIAGNOSIS — E1165 Type 2 diabetes mellitus with hyperglycemia: Secondary | ICD-10-CM

## 2024-09-04 ENCOUNTER — Other Ambulatory Visit: Payer: Self-pay | Admitting: "Endocrinology

## 2024-09-04 DIAGNOSIS — E1165 Type 2 diabetes mellitus with hyperglycemia: Secondary | ICD-10-CM

## 2024-09-10 ENCOUNTER — Ambulatory Visit
Admission: RE | Admit: 2024-09-10 | Discharge: 2024-09-10 | Disposition: A | Source: Ambulatory Visit | Attending: Obstetrics and Gynecology

## 2024-09-10 DIAGNOSIS — Z1231 Encounter for screening mammogram for malignant neoplasm of breast: Secondary | ICD-10-CM

## 2024-10-26 ENCOUNTER — Other Ambulatory Visit: Payer: Self-pay | Admitting: "Endocrinology

## 2024-10-26 DIAGNOSIS — E1165 Type 2 diabetes mellitus with hyperglycemia: Secondary | ICD-10-CM

## 2025-02-06 ENCOUNTER — Ambulatory Visit: Admitting: "Endocrinology
